# Patient Record
Sex: Female | Born: 1975 | Hispanic: Yes | Marital: Married | Smoking: Current every day smoker
Health system: Southern US, Community
[De-identification: ages and names within clinical notes are randomized; demographics above are authoritative.]

## PROBLEM LIST (undated history)

## (undated) DIAGNOSIS — D649 Anemia, unspecified: Secondary | ICD-10-CM

## (undated) DIAGNOSIS — R12 Heartburn: Secondary | ICD-10-CM

## (undated) DIAGNOSIS — F329 Major depressive disorder, single episode, unspecified: Secondary | ICD-10-CM

## (undated) DIAGNOSIS — F32A Depression, unspecified: Secondary | ICD-10-CM

## (undated) DIAGNOSIS — F419 Anxiety disorder, unspecified: Secondary | ICD-10-CM

---

## 2002-01-11 ENCOUNTER — Encounter: Payer: Self-pay | Admitting: Emergency Medicine

## 2002-01-11 ENCOUNTER — Emergency Department (HOSPITAL_COMMUNITY): Admission: EM | Admit: 2002-01-11 | Discharge: 2002-01-11 | Payer: Self-pay | Admitting: Emergency Medicine

## 2002-07-13 ENCOUNTER — Inpatient Hospital Stay (HOSPITAL_COMMUNITY): Admission: AD | Admit: 2002-07-13 | Discharge: 2002-07-13 | Payer: Self-pay

## 2002-09-24 ENCOUNTER — Ambulatory Visit (HOSPITAL_COMMUNITY): Admission: RE | Admit: 2002-09-24 | Discharge: 2002-09-24 | Payer: Self-pay

## 2002-11-02 ENCOUNTER — Inpatient Hospital Stay (HOSPITAL_COMMUNITY): Admission: AD | Admit: 2002-11-02 | Discharge: 2002-11-02 | Payer: Self-pay

## 2002-11-03 ENCOUNTER — Inpatient Hospital Stay (HOSPITAL_COMMUNITY): Admission: AD | Admit: 2002-11-03 | Discharge: 2002-11-05 | Payer: Self-pay

## 2003-01-26 ENCOUNTER — Inpatient Hospital Stay (HOSPITAL_COMMUNITY): Admission: AD | Admit: 2003-01-26 | Discharge: 2003-01-26 | Payer: Self-pay | Admitting: Obstetrics and Gynecology

## 2003-02-01 ENCOUNTER — Inpatient Hospital Stay (HOSPITAL_COMMUNITY): Admission: AD | Admit: 2003-02-01 | Discharge: 2003-02-05 | Payer: Self-pay | Admitting: Obstetrics and Gynecology

## 2003-02-02 ENCOUNTER — Encounter (INDEPENDENT_AMBULATORY_CARE_PROVIDER_SITE_OTHER): Payer: Self-pay

## 2003-10-26 ENCOUNTER — Other Ambulatory Visit: Admission: RE | Admit: 2003-10-26 | Discharge: 2003-10-26 | Payer: Self-pay | Admitting: Obstetrics and Gynecology

## 2004-02-14 ENCOUNTER — Inpatient Hospital Stay (HOSPITAL_COMMUNITY): Admission: AD | Admit: 2004-02-14 | Discharge: 2004-02-14 | Payer: Self-pay | Admitting: Obstetrics and Gynecology

## 2004-04-12 ENCOUNTER — Inpatient Hospital Stay (HOSPITAL_COMMUNITY): Admission: AD | Admit: 2004-04-12 | Discharge: 2004-04-12 | Payer: Self-pay | Admitting: Obstetrics and Gynecology

## 2004-04-23 ENCOUNTER — Inpatient Hospital Stay (HOSPITAL_COMMUNITY): Admission: RE | Admit: 2004-04-23 | Discharge: 2004-04-25 | Payer: Self-pay | Admitting: Obstetrics and Gynecology

## 2004-04-26 ENCOUNTER — Emergency Department (HOSPITAL_COMMUNITY): Admission: EM | Admit: 2004-04-26 | Discharge: 2004-04-26 | Payer: Self-pay

## 2004-11-16 ENCOUNTER — Encounter: Admission: RE | Admit: 2004-11-16 | Discharge: 2004-12-26 | Payer: Self-pay | Admitting: Family Medicine

## 2005-05-23 ENCOUNTER — Encounter: Admission: RE | Admit: 2005-05-23 | Discharge: 2005-05-23 | Payer: Self-pay | Admitting: Family Medicine

## 2006-09-25 ENCOUNTER — Other Ambulatory Visit: Admission: RE | Admit: 2006-09-25 | Discharge: 2006-09-25 | Payer: Self-pay | Admitting: Obstetrics and Gynecology

## 2007-10-12 ENCOUNTER — Ambulatory Visit (HOSPITAL_COMMUNITY): Admission: RE | Admit: 2007-10-12 | Discharge: 2007-10-12 | Payer: Self-pay | Admitting: Obstetrics and Gynecology

## 2007-10-14 ENCOUNTER — Inpatient Hospital Stay (HOSPITAL_COMMUNITY): Admission: AD | Admit: 2007-10-14 | Discharge: 2007-10-14 | Payer: Self-pay | Admitting: Obstetrics and Gynecology

## 2007-10-16 ENCOUNTER — Ambulatory Visit (HOSPITAL_COMMUNITY): Admission: RE | Admit: 2007-10-16 | Discharge: 2007-10-16 | Payer: Self-pay | Admitting: Obstetrics and Gynecology

## 2007-10-19 ENCOUNTER — Ambulatory Visit (HOSPITAL_COMMUNITY): Admission: RE | Admit: 2007-10-19 | Discharge: 2007-10-19 | Payer: Self-pay | Admitting: Obstetrics and Gynecology

## 2009-05-10 ENCOUNTER — Inpatient Hospital Stay (HOSPITAL_COMMUNITY): Admission: AD | Admit: 2009-05-10 | Discharge: 2009-05-10 | Payer: Self-pay | Admitting: Obstetrics & Gynecology

## 2010-04-09 ENCOUNTER — Encounter: Payer: Self-pay | Admitting: Obstetrics and Gynecology

## 2010-06-06 LAB — URINALYSIS, ROUTINE W REFLEX MICROSCOPIC
Bilirubin Urine: NEGATIVE
Ketones, ur: NEGATIVE mg/dL
Nitrite: NEGATIVE
Protein, ur: NEGATIVE mg/dL
Specific Gravity, Urine: <1.005 — ABNORMAL LOW (ref 1.005–1.030)
Urobilinogen, UA: 0.2 mg/dL (ref 0.0–1.0)

## 2010-06-06 LAB — CBC
HCT: 39.6 % (ref 36.0–46.0)
Hemoglobin: 13.1 g/dL (ref 12.0–15.0)
RBC: 4.81 MIL/uL (ref 3.87–5.11)
WBC: 7 10*3/uL (ref 4.0–10.5)

## 2010-06-06 LAB — HCG, SERUM, QUALITATIVE

## 2010-06-06 LAB — URINE MICROSCOPIC-ADD ON

## 2010-08-03 NOTE — Op Note (Signed)
[name], [name]                          ACCOUNT NO.:  [id number]   MEDICAL RECORD NO.:  [id number]                   PATIENT TYPE:  INP   LOCATION:  9108                                 [facility]ILITY:  [facility]   PHYSICIAN:  James A. Ashley Royalty, M.D.             DATE OF BIRTH:  1975-09-13   DATE OF PROCEDURE:  02/02/2003  DATE OF DISCHARGE:                                 OPERATIVE REPORT   PREOPERATIVE DIAGNOSES:  1. Intrauterine pregnancy at 38.[redacted] weeks gestation.  2. Premature rupture of membranes.  3. Bradycardia in labor.   POSTOPERATIVE DIAGNOSES:  1. Intrauterine pregnancy at 38.[redacted] weeks gestation.  2. Fibroid uterus (small).  3. Nuchal cord times one.   OPERATION PERFORMED:  Primary low-transverse cesarean section.   SURGEON:  Rudy Jew. Ashley Royalty, M.D.   ANESTHESIA:  Epidural.   FINDINGS:  Seven pound 2 ounce  female with Apgars 8 at one minute and 9 at  five minutes; sent to the newborn nursery.  Arterial cord pH 7.23 and venous  cord pH 7.30.   ESTIMATED BLOOD LOSS:  Eight-hundred milliliters.   COMPLICATIONS:  None.   PACKS AND DRAINS:  Foley.   COUNTS:  Sponge, needle and instrument counts reported as correct times two.   DESCRIPTION OF OPERATION:  The patient was brought to the operating room and  placed in the dorsal supine position.  She had a functioning epidural in  place as well as a Foley catheter.  She was prepped and draped in usual  manner for abdominal surgery.  A Pfannenstiel incision was made down to the  level of the fascia.  The fascia was nick with a knife and incised  transversely with Mayo scissors.  The underlying rectus muscle was separated  from the overlying fascia using sharp and blunt dissection.  The rectus  muscle was separated in the midline  exposing the peritoneum, which was  elevated with hemostats and entered atraumatically with Metzenbaum scissors.  The incision was extended longitudinally.   The uterus was identified and the  bladder flap created by incising the  anterior uterine serosa, and sharply and bluntly dissecting the bladder  inferiorly.  This was held in place with a bladder blade.  The uterus was  then entered through a low-transverse incision using sharp and blunt  dissection.   The fluid was noted to be clear.  The infant was delivered from the vertex  presentation.  Upon delivery of the head a nuchal cord was noted.  The  infant was suctioned.  The cord was triply clamped and cut, and the infant  given immediately to the waiting pediatrics team.  Arterial and venous cord  pH determinations were obtained.  Regular cord blood was obtained.  The  placenta and membranes were removed in their entirety and submitted to  pathology for histologic studies.   The uterus was exteriorized.  The uterus was noted to have a 1.5  cm fibroid  on the anterior surface and a 1/2-1 cm fibroid on the posterior surface.  The uterus was closed with two running layers of #1 Vicryl, the first layer  with a running locking layer; the second was a running, intermittent locking  and imbricating layer.  Hemostasis was noted.  The uterus, tubes and ovaries  were inspected and found otherwise normal.  They were returned to the  abdominal cavity.  Copious irrigation was accomplished.  Hemostasis was  noted.  The rectus muscles were then reapproximated in midline using 0-  Vicryl in an interrupted fashion.  The fascia was closed with 0-Vicryl in an  interrupted fashion.  The skin was closed with staples.   The patient tolerated the procedure extremely well and was taken to the  recovery room in good condition.                                               James A. Ashley Royalty, M.D.    JAM/MEDQ  D:  02/02/2003  T:  02/02/2003  Job:  [id number]

## 2010-08-03 NOTE — Consult Note (Signed)
[name], [name]                          ACCOUNT NO.:  [id number]   MEDICAL RECORD NO.:  [id number]                   PATIENT TYPE:  INP   LOCATION:  9159                                 [facility]   PHYSICIAN:  Thornton Park. Daphine Deutscher, M.D.             DATE OF BIRTH:  12-Mar-1976   DATE OF CONSULTATION:  DATE OF DISCHARGE:                                   CONSULTATION   CONSULTING PHYSICIAN:  Dr. Molli Hazard B. Daphine Deutscher.   CHIEF COMPLAINT:  Lower abdominal pain x 24 hours.   HISTORY:  This is a 35 year old lady gravida 1, para 0 whose is [redacted] weeks  gestation with a little girl, came to the ED yesterday really with low-back  pain.  She took some Robaxin which improved her back pain, but then she  started having abdominal pain bilaterally and lower pain.  Earlier today,  she had an ultrasound mainly looking at fetal movement.  She, however,  continued to complain of some lower abdominal pain that has been persistent.  She remains hungry and has not had any nausea or vomiting, although, when  she is sore it may diminish her appetite.  Currently taking Tylenol for  pain.   PHYSICAL EXAMINATION:  ABDOMEN:  Was done in the room and exam showed her to  be gravid with uterus palpated, appeared to be up to the umbilicus and  above.  The pain seemed to be more below this in the suprapubic area and  kind of bilaterally.  She did mention that when the baby kicks that this  does generate some pain.  There is no pain that is more remarkable at  McBurney's point and no pain higher than McBurney's point which would  anticipated location of the appendix at this stage in her pregnancy.  She  has had apparently normal bowel movements, no diarrhea.   CURRENT LABORATORY:  Shows a hemoglobin of 10.6 with a white count of 8,800  with a normal differential.  Amylase is normal.  Liver function studies are  all normal.   IMPRESSION:  Abdominal pain, lower abdomen, which seems that it has been  persistent today.  There is no rebound or guarding and this seems lower than  what I would expect for an appendix.   Discussed this with Dr. Galen Daft and we will get a CBC with diff in the morning.  If her pain is persistent then we may proceed with a CT scan of her abdomen  pelvis to try and look at her appendix.  Will follow.                                                Thornton Park Daphine Deutscher, M.D.    MBM/MEDQ  D:  11/03/2002  T:  11/03/2002  Job:  [id number]   cc:   Ronda Fairly. Galen Daft, M.D.  [address], [address]  [place]  [place] [address]  Fax: [phone]

## 2010-08-03 NOTE — Discharge Summary (Signed)
[name], [name]                          ACCOUNT NO.:  [id number]   MEDICAL RECORD NO.:  [id number]                   PATIENT TYPE:  INP   LOCATION:  9159                                 [facility]   PHYSICIAN:  Ronda Fairly. Galen Daft, M.D.              DATE OF BIRTH:  1975/05/28   DATE OF ADMISSION:  11/03/2002  DATE OF DISCHARGE:  11/05/2002                                 DISCHARGE SUMMARY   ADMISSION DIAGNOSES:  1. Lower abdominal pain.  2. Pregnancy.   PRINCIPAL DIAGNOSIS:  1. Lower abdominal pain.  2. Pregnancy.  3. Terminal ileitis.   PRINCIPAL PROCEDURE:  CT scan of abdomen and pelvis.   SECONDARY PROCEDURE:  Fetal ultrasound.   SECONDARY DIAGNOSIS:  Uterine fibroid.   COMPLICATIONS OF HOSPITALIZATION:  None.   CONDITION AT DISCHARGE:  Improved.   HOSPITAL COURSE:  The patient was admitted for lower abdominal pain, rated  as a 10 out of 39, on November 03, 2002.  She underwent ultrasound testing  which revealed a single intrauterine pregnancy, breech presentation,  appropriate growth without any remarkable findings.  Her white count was  normal.  She was afebrile and she was tolerating a regular diet.  Her  examination was consistent with lower abdominal pain, nonspecific findings.  General surgery was consulted and also maternal fetal medicine.  After  discussing the risks of radiographic imaging with CT scan and the failure of  complete resolution of her symptoms, on the 19th the patient underwent a CT  scan of the abdomen which revealed terminal ileitis.  Otherwise the scan was  unremarkable.  The appendix was not well visualized but there was no  evidence of an appendiceal involvement.  The white count was normal on the  day of discharge at 7.7 and her hemoglobin was 10.6 grams.  She had an  afebrile and benign examination.  She was alert, oriented, vital signs were  stable and the fetus had normal fetal heart tones.  She was discharged home  with full  instructions regarding activity limits, followup in the office.   ACTIVITY:  Her activity limits are to be at bedrest.   FOLLOWUP:  Follow up in the office next week, either Thursday or Friday.   Call the office is she developed fever, increasing abdominal pain or any  other potentially uncomfortable symptoms.  Her cervix was long and closed  throughout the hospitalization.  She had no medications on the day of  discharge.  She actually was asked to discontinue her Robaxin for previous  back discomfort and just to continue with vitamins with iron.                                                 Ronda Fairly. Galen Daft, M.D.  NJT/MEDQ  D:  11/05/2002  T:  11/05/2002  Job:  [id number]

## 2010-08-03 NOTE — Discharge Summary (Signed)
NAMECELLA, CAPPELLO                          ACCOUNT NO.:  [id number]   MEDICAL RECORD NO.:  [id number]                   PATIENT TYPE:  INP   LOCATION:  9108                                 [facility]   PHYSICIAN:  Charles A. Sydnee Cabal, MD            DATE OF BIRTH:  [birth year]   DATE OF ADMISSION:  02/01/2003  DATE OF DISCHARGE:  02/05/2003                                 DISCHARGE SUMMARY   For History and Physical see note written on the chart dated February 01, 2003.   DISPOSITION:  The patient discharged to home to follow up in the office in  two days to have staples discontinued.  She was given prescription for  Lortab 5/325 mg p.r.n. q.4 h. per Gaetano Hawthorne. Lily Peer, M.D. and convalescent  instructions were given by Dr. Lily Peer.   HOSPITAL COURSE:  The patient was admitted with ruptured membranes at term.  She was 38 weeks and four days.  She was admitted, underwent Pitocin  induction.  Pitocin was used secondary to the fact that she did not progress  into active labor on her own.  She was continued on Pitocin and was noted  during the night to have bradycardia several times to the 50s and 60s and  70s respectively.  For this reason, Fayrene Fearing A. Ashley Royalty, M.D. took the patient  to cesarean section secondary to fetal bradycardia.  Postoperatively, the  patient had routine postoperative course.  She had low-grade temperature of  100.4 on postop day #1.  She was ambulated, got out of bed.  There was  improvement in vital signs, normal temperature.  Hematocrit returned 26.6,  hemoglobin 9.1 on postop day #1.  She continues to do well postop day #2 and  postop day #3 was discharged home with follow up as noted above.  She had  spontaneous return of bowel function postop day #2 and was given general  diet, was voiding without difficulty after Foley was discontinued on postop  day #1.                                               Charles A. Sydnee Cabal, MD    CAD/MEDQ  D:   03/02/2003  T:  03/02/2003  Job:  [id number]

## 2010-08-03 NOTE — Discharge Summary (Signed)
[name], [name]              ACCOUNT NO.:  [id number]   MEDICAL RECORD NO.:  [id number]          PATIENT TYPE:  INP   LOCATION:  9116                          [facility]   PHYSICIAN:  Charles A. Delcambre, MDDATE OF BIRTH:  May 05, 1975   DATE OF ADMISSION:  04/23/2004  DATE OF DISCHARGE:                                 DISCHARGE SUMMARY   PRIMARY DISCHARGE DIAGNOSES:  1.  Intrauterine pregnancy at 39 weeks.  2.  Previous cesarean section.   PROCEDURE:  Repeat low transverse cesarean section.   DISPOSITION:  The patient discharged home to follow up in the office in 48  hours to discontinue staples.  She was given convalescent instructions,  notify of temperature greater than 100 degrees, increased pain or bleeding,  erythema about the incision, drainage from the incision, or purulence. No  heavy lifting greater than 30 pounds for 1 month, no driving for 2 weeks.  A  prescription for Percocet one to two p.o. q.4h. p.r.n. #40 and Chromagen one  p.o. daily #30 refill x1.  Postoperative hematocrit 27.5, hemoglobin 9.5.   HISTORY OF PRESENT ILLNESS:  Dictated and on the chart.   HOSPITAL COURSE:  The patient was admitted and underwent surgery as noted  above.  Postoperatively, the patient did well.  She had a vigorous female,  Apgars 9 and 9 at delivery.  Foley catheter was discontinued postoperative  day #1 and she voided without difficulty.  She was ambulating without  difficulty.  Flatus returned postoperative day #1.  She was given a general  diet.  She had no febrile morbidity.  Having done well, she desired to be  discharged home on postoperative day #2 and this was done at her request.      CAD/MEDQ  D:  04/25/2004  T:  04/25/2004  Job:  [id number]

## 2010-08-03 NOTE — Op Note (Signed)
NAMEKARSTYN, BIRKEY              ACCOUNT NO.:  [id number]   MEDICAL RECORD NO.:  [id number]          PATIENT TYPE:  INP   LOCATION:  9198                          [facility]   PHYSICIAN:  Charles A. Delcambre, MDDATE OF BIRTH:  1976/01/19   DATE OF PROCEDURE:  04/23/2004  DATE OF DISCHARGE:                                 OPERATIVE REPORT   PREOPERATIVE DIAGNOSES:  1.  Intrauterine pregnancy at 39 weeks.  2.  Previous cesarean section.   PROCEDURE:  Repeat low transverse cesarean section.   SURGEON:  Charles A. Sydnee Cabal, M.D.   ASSISTANT:  Rudy Jew. Ashley Royalty, M.D.   COMPLICATIONS:  None.   ESTIMATED BLOOD LOSS:  600 mL.   SPECIMENS:  None.   OPERATIVE FINDINGS:  A vigorous female, Apgars 9 and 9.   ANESTHESIA:  Spinal.   COMPLICATIONS:  None.   Instrument, sponge and needle count correct x2.   DESCRIPTION OF PROCEDURE:  The patient was taken to the operating room and  placed in the supine position.  After spinal was induced and anesthesia was  adequate, sterile prep and drape was undertaken.  A Pfannenstiel incision  was made and carried down to the fascia.  The fascia was incised with a  knife and Mayo scissors.  The rectus sheath was released superiorly and  inferiorly sharply.  The rectus muscles were sharply dissected in the  midline.  The peritoneum was entered without damage to bowel and bladder or  vascular structures and taken superiorly and inferiorly sharply and the  bladder flap was releasing the vesicouterine peritoneum sharply across the  lower uterine segment.  The bladder flap was brought down sharply without  damage to the bladder.  The bladder blade was placed and a lower uterine  segment transverse incision was made with a knife to amniotomy.  There was  no damage to the baby.  Traction was used to extend the incision.  __________ was used to lift the head to the incision site.  The vacuum  extractor was used to assist in delivery of the infant,  and fundal pressure  was placed by the operator's assistant.  The infant was delivered without  difficulty.  The cord was clamped.  The infant was shown to the parents,  handed off the neonatology personnel.  The placenta was manually expressed.  Internal surface of the uterus was wiped with a moistened lap.  The uterus  was closed with #1 Vicryl running locked suture in two layers, the first  layer imbricating over a second layer.  Hemostasis was adequate.  Irrigation  was carried out.  Bladder flap  hemostasis was excellent.  Subfascial hemostasis was excellent.  The fascia  was closed with #1 Vicryl running nonlocking suture.  Subcutaneous  hemostasis was excellent, and sterile skin clips were used to close the  skin.  A sterile dressing was applied.  The patient was taken to the  recovery room with physician in attendance.      CAD/MEDQ  D:  04/23/2004  T:  04/23/2004  Job:  [id number]

## 2010-08-03 NOTE — H&P (Signed)
[name], [name]              ACCOUNT NO.:  [id number]   MEDICAL RECORD NO.:  [id number]          PATIENT TYPE:  INP   LOCATION:  NA                            [facility]   PHYSICIAN:  Charles A. Delcambre, MDDATE OF BIRTH:  [birth year]   DATE OF ADMISSION:  04/23/2004  DATE OF DISCHARGE:                                HISTORY & PHYSICAL   The patient will be admitted April 23, 2004, for a repeat cesarean  section.  [facility] May 02, 2004.  At 39 weeks estimated gestational age.  She accepts risks of infection, bleeding, bowel or bladder injury,  __________ blood products risks including hepatitis and HIV exposure.  All  questions were answered.  Informed consent is given.  Prenatal labs are O  positive antibody screen negative, VDRL nonreactive, rubella immune,  hepatitis B surface antigen negative, HIV negative.  GC and Chlamydia  negative.  Cystic fibrosis negative.  TSH normal.  Quad screen normal.  One-  hour Glucola 111.  Hemoglobin 10.1 at 28 weeks.  Group B Streptococcus done  and pending as of April 18, 2004.   PAST MEDICAL HISTORY:  Negative.   SURGICAL HISTORY:  Cesarean section, low transverse.   MEDICATIONS:  1.  Prenatal vitamins.  2.  Iron.   ALLERGIES:  No known drug allergies.   SOCIAL HISTORY:  Past history of smoking, none currently.  No drug use.  The  patient is not married.  She is in a relationship monogamously.   FAMILY HISTORY:  Cousin with gastroschisis.  Otherwise negative.   REVIEW OF SYSTEMS:  Currently, no chest pain, shortness of breath, fever,  chills, cough, nausea, vomiting, bleeding, contractions, or rupture of  membranes.  She notes active fetal movement.   PHYSICAL EXAMINATION:  GENERAL:  Alert, oriented x 3.  BLOOD PRESSURE:  100/72.  RESPIRATIONS:  20.  PULSE:  90.  HEENT:  Grossly within normal limits.  NECK:  Supple, without thyromegaly or adenopathy.  LUNGS:  Clear bilaterally.  HEART:  Regular rate and rhythm.  2/6  systolic ejection murmur left sternal  border.  BREAST:  No masses, tenderness, or discharge.  No nipple changes  bilaterally.  ABDOMEN:  Fundal height 36 cm.  Fetal heart rate 150.  PELVIC:  Normal external female genitalia.  Digital exam of the cervix  posterior and closed.  EXTREMITIES:  Mild edema.   ASSESSMENT:  Intrauterine pregnancy.  Plan to be 39 weeks at time of  admission, for repeat cesarean section.   PLAN:  N.p.o. past midnight.  Preoperative CBC __________.  All questions  were answered.  Will proceed as outlined for repeat cesarean section.      CAD/MEDQ  D:  04/18/2004  T:  04/18/2004  Job:  [id number]

## 2010-09-21 LAB — ABO/RH: RH Type: POSITIVE

## 2010-10-17 ENCOUNTER — Encounter (HOSPITAL_COMMUNITY): Payer: Self-pay | Admitting: *Deleted

## 2010-10-17 ENCOUNTER — Inpatient Hospital Stay (HOSPITAL_COMMUNITY)
Admission: AD | Admit: 2010-10-17 | Discharge: 2010-10-17 | Disposition: A | Discharge status: Home / Self Care | Payer: Medicaid Other | Source: Ambulatory Visit | Attending: Obstetrics & Gynecology | Admitting: Obstetrics & Gynecology

## 2010-10-17 DIAGNOSIS — Z87891 Personal history of nicotine dependence: Secondary | ICD-10-CM | POA: Insufficient documentation

## 2010-10-17 DIAGNOSIS — R109 Unspecified abdominal pain: Secondary | ICD-10-CM | POA: Insufficient documentation

## 2010-10-17 DIAGNOSIS — R350 Frequency of micturition: Secondary | ICD-10-CM | POA: Insufficient documentation

## 2010-10-17 DIAGNOSIS — R300 Dysuria: Secondary | ICD-10-CM

## 2010-10-17 DIAGNOSIS — R3915 Urgency of urination: Secondary | ICD-10-CM | POA: Insufficient documentation

## 2010-10-17 HISTORY — DX: Depression, unspecified: F32.A

## 2010-10-17 HISTORY — DX: Major depressive disorder, single episode, unspecified: F32.9

## 2010-10-17 HISTORY — DX: Anxiety disorder, unspecified: F41.9

## 2010-10-17 LAB — URINALYSIS, ROUTINE W REFLEX MICROSCOPIC
Glucose, UA: NEGATIVE mg/dL
Hgb urine dipstick: NEGATIVE
Leukocytes, UA: NEGATIVE
Specific Gravity, Urine: >1.03 — ABNORMAL HIGH (ref 1.005–1.030)

## 2010-10-17 MED ORDER — CEPHALEXIN 500 MG PO CAPS: 500.0000 mg | ORAL_CAPSULE | Freq: Four times a day (QID) | ORAL | Status: AC

## 2010-10-17 NOTE — ED Provider Notes (Signed)
History   Pt presents today c/o urinary frequency and abd pain for the past week. She states the pain is relieved when she urinates. She states it feels like she has a UTI. She denies fever, vag dc, bleeding, or any other sx at this time.  Chief Complaint  Patient presents with  . Dysuria   HPI  OB History    Grav Para Term Preterm Abortions TAB SAB Ect Mult Living   5 2 2  2  2   2       Past Medical History  Diagnosis Date  . Depression   . Anxiety     Past Surgical History  Procedure Date  . Cesarean section     No family history on file.  History  Substance Use Topics  . Smoking status: Former Games developer  . Smokeless tobacco: Not on file  . Alcohol Use: No    Allergies:  Allergies  Allergen Reactions  . Codeine Other (See Comments)    Unsure .[name] Pt states at times she still takes the codeine.  [name] Percocet (Oxycodone-Acetaminophen) Swelling    Throat swells  . Latex Rash    Around vaginal area gets irratation.    Prescriptions prior to admission  Medication Sig Dispense Refill  . acetaminophen (TYLENOL) 325 MG tablet Take 650 mg by mouth every 6 (six) hours as needed. As needed for pain       . loratadine (CLARITIN) 10 MG tablet Take 10 mg by mouth daily.        . prenatal vitamin w/FE, FA (PRENATAL 1 + 1) 27-1 MG TABS Take 1 tablet by mouth daily.          Review of Systems  Constitutional: Negative for fever and chills.  Cardiovascular: Negative for chest pain.  Gastrointestinal: Negative for nausea, vomiting, diarrhea and constipation.  Genitourinary: Positive for dysuria, urgency and frequency. Negative for hematuria and flank pain.  Neurological: Negative for dizziness and headaches.  Psychiatric/Behavioral: Negative for depression and suicidal ideas.   Physical Exam   Blood pressure 103/67, pulse 89, temperature 98.6 F (37 C), temperature source Oral, resp. rate 16, height 5\' 2"  (1.575 m), weight 161 lb (73.029 kg), last menstrual period  08/09/2010.  Physical Exam  Constitutional: She is oriented to person, place, and time. She appears well-developed and well-nourished. No distress.  HENT:  Head: Normocephalic and atraumatic.  Eyes: EOM are normal. Pupils are equal, round, and reactive to light.  GI: Soft. She exhibits no distension and no mass. There is no tenderness. There is no rebound and no guarding.  Genitourinary: No vaginal discharge found.  Neurological: She is alert and oriented to person, place, and time.  Skin: Skin is warm and dry. She is not diaphoretic.  Psychiatric: She has a normal mood and affect. Thought content normal.    MAU Course  Procedures  Results for orders placed during the hospital encounter of 10/17/10 (from the past 24 hour(s))  URINALYSIS, ROUTINE W REFLEX MICROSCOPIC     Status: Abnormal   Collection Time   10/17/10 10:00 PM      Component Value Range   Color, Urine YELLOW  YELLOW    Appearance CLEAR  CLEAR    Specific Gravity, Urine >1.030 (*) 1.005 - 1.030    pH 6.0  5.0 - 8.0    Glucose, UA NEGATIVE  NEGATIVE (mg/dL)   Hgb urine dipstick NEGATIVE  NEGATIVE    Bilirubin Urine NEGATIVE  NEGATIVE    Ketones, ur NEGATIVE  NEGATIVE (mg/dL)   Protein, ur NEGATIVE  NEGATIVE (mg/dL)   Urobilinogen, UA 0.2  0.0 - 1.0 (mg/dL)   Nitrite NEGATIVE  NEGATIVE    Leukocytes, UA NEGATIVE  NEGATIVE      Assessment and Plan  Dysuria: discussed with pt at length. Will tx prophylactically with Keflex. She has a f/u appt scheduled. Discussed diet, activity, risks, and precautions.   10/17/2010, 11:46 PM   Henrietta Hoover, PA 10/17/10 2350

## 2010-10-17 NOTE — Progress Notes (Cosign Needed)
Pt having frequency and pain with urination, lower back pain and lower abd pressure.  Pt G5 P2 at 11.2wks.

## 2010-10-31 ENCOUNTER — Other Ambulatory Visit (HOSPITAL_COMMUNITY)
Admission: RE | Admit: 2010-10-31 | Discharge: 2010-10-31 | Disposition: A | Discharge status: Home / Self Care | Payer: Medicaid Other | Source: Ambulatory Visit | Attending: Obstetrics and Gynecology | Admitting: Obstetrics and Gynecology

## 2010-10-31 ENCOUNTER — Other Ambulatory Visit: Payer: Self-pay | Admitting: Obstetrics and Gynecology

## 2010-10-31 DIAGNOSIS — Z113 Encounter for screening for infections with a predominantly sexual mode of transmission: Secondary | ICD-10-CM | POA: Insufficient documentation

## 2010-10-31 DIAGNOSIS — Z01419 Encounter for gynecological examination (general) (routine) without abnormal findings: Secondary | ICD-10-CM | POA: Insufficient documentation

## 2010-12-14 LAB — CBC
HCT: 35.6 — ABNORMAL LOW
MCV: 82.3
RBC: 4.32
WBC: 8.6

## 2010-12-14 LAB — HCG, QUANTITATIVE, PREGNANCY: hCG, Beta Chain, Quant, S: 6352 — ABNORMAL HIGH

## 2011-01-02 ENCOUNTER — Encounter (HOSPITAL_COMMUNITY): Payer: Self-pay | Admitting: *Deleted

## 2011-01-02 ENCOUNTER — Inpatient Hospital Stay (HOSPITAL_COMMUNITY)
Admission: AD | Admit: 2011-01-02 | Discharge: 2011-01-03 | Disposition: A | Discharge status: Home / Self Care | Payer: Medicaid Other | Source: Ambulatory Visit | Attending: Obstetrics and Gynecology | Admitting: Obstetrics and Gynecology

## 2011-01-02 ENCOUNTER — Inpatient Hospital Stay (HOSPITAL_COMMUNITY): Payer: Medicaid Other

## 2011-01-02 DIAGNOSIS — O26899 Other specified pregnancy related conditions, unspecified trimester: Secondary | ICD-10-CM

## 2011-01-02 DIAGNOSIS — R109 Unspecified abdominal pain: Secondary | ICD-10-CM

## 2011-01-02 DIAGNOSIS — O99891 Other specified diseases and conditions complicating pregnancy: Secondary | ICD-10-CM | POA: Insufficient documentation

## 2011-01-02 LAB — URINALYSIS, ROUTINE W REFLEX MICROSCOPIC
Ketones, ur: NEGATIVE mg/dL
Leukocytes, UA: NEGATIVE
Protein, ur: NEGATIVE mg/dL
Specific Gravity, Urine: 1.01 (ref 1.005–1.030)
pH: 7 (ref 5.0–8.0)

## 2011-01-02 MED ORDER — ACETAMINOPHEN 325 MG PO TABS
650.0000 mg | ORAL_TABLET | Freq: Once | ORAL | Status: AC
  Administered 2011-01-02: 650 mg via ORAL
  Filled 2011-01-02: qty 2

## 2011-01-02 NOTE — Progress Notes (Signed)
Pt reports pain in lower abd x 5 hours. Pt reports she had to "break up " her two dogs that were fighting and the pain started right after that, states she was not attacked by the dogs. Denies vaginal bleeding. G5 P2 , SAB x 2

## 2011-01-02 NOTE — ED Provider Notes (Signed)
History     Chief Complaint  Patient presents with  . Abdominal Pain   HPIJessica Taralyn Johnston y.o. patient of Dr. Ardeen Fillers is now 22w gestations and presents with lower abdominal pain.  G5 [id number].  Pain began at 5:30 after she had to separate her cat and dogs.  Denies falling but had to get "on top of the dog to get cat out of the dog's mouth".  Denies vaginal bleeding or leaking fluid.  States the pain is mostly lower but now on the sides.  She has spoken to Dr. Dion Body twice.  Rested as advised.  Rates the pain as 3/10.  Has not tried anything for the pain.     Past Medical History  Diagnosis Date  . Depression   . Anxiety     Past Surgical History  Procedure Date  . Cesarean section     History reviewed. No pertinent family history.  History  Substance Use Topics  . Smoking status: Former Games developer  . Smokeless tobacco: Not on file  . Alcohol Use: No    Allergies:  Allergies  Allergen Reactions  . Codeine Other (See Comments)    Unsure .[name] Pt states at times she still takes the codeine.  [name] Percocet (Oxycodone-Acetaminophen) Swelling    Throat swells  . Latex Rash    Around vaginal area gets irratation.    Prescriptions prior to admission  Medication Sig Dispense Refill  . acetaminophen (TYLENOL) 325 MG tablet Take 650 mg by mouth every 6 (six) hours as needed. for pain      . loratadine (CLARITIN) 10 MG tablet Take 10 mg by mouth daily.        . prenatal vitamin w/FE, FA (PRENATAL 1 + 1) 27-1 MG TABS Take 1 tablet by mouth daily.          Review of Systems  Gastrointestinal: Positive for abdominal pain. Negative for nausea and vomiting.  Genitourinary:       Neg for vaginal bleeding or loss of fluid.  Musculoskeletal: Negative for falls.   Physical Exam   Blood pressure 104/58, pulse 93, temperature 99 F (37.2 C), resp. rate 18, height 5\' 2"  (1.575 m), weight 170 lb (77.111 kg), last menstrual period 08/09/2010.  Physical Exam  Constitutional: She is  oriented to person, place, and time. She appears well-developed and well-nourished. No distress.       FHR by doppler 145  GI: She exhibits no mass. There is tenderness (lower quadrants bilaterally.  more on the left.). There is no rebound and no guarding.       Gravid with fundus just above the umbilicus  Neurological: She is alert and oriented to person, place, and time.  Skin: Skin is warm and dry.   Results for orders placed during the hospital encounter of 01/02/11 (from the past 24 hour(s))  URINALYSIS, ROUTINE W REFLEX MICROSCOPIC     Status: Abnormal   Collection Time   01/02/11 10:05 PM      Component Value Range   Color, Urine YELLOW  YELLOW    Appearance HAZY (*) CLEAR    Specific Gravity, Urine 1.010  1.005 - 1.030    pH 7.0  5.0 - 8.0    Glucose, UA 500 (*) NEGATIVE (mg/dL)   Hgb urine dipstick NEGATIVE  NEGATIVE    Bilirubin Urine NEGATIVE  NEGATIVE    Ketones, ur NEGATIVE  NEGATIVE (mg/dL)   Protein, ur NEGATIVE  NEGATIVE (mg/dL)   Urobilinogen, UA 0.2  0.0 -  1.0 (mg/dL)   Nitrite NEGATIVE  NEGATIVE    Leukocytes, UA NEGATIVE  NEGATIVE     ULTRASOUND REPORT:  22w gestation, FNR 144, Placental posterior , above cervical os; no definite evidence of acute placental abruption.  Amniotic Fluid Vol, subjectively WNL.  Cx length 4.8.  EDD 05/08/11  MAU Course  Procedures  MDM Spoke with Dr. Idamae Schuller at 10:50.  Order given to give Tylenol 2 tabs po now and ultrasound if abdominal pain.  Keep scheduled appt with Dr. Richardson Dopp for tomorrow at noon.   Patient states she can take Tylenol without side effects.    Assessment and Plan  A: Abdominal pain at 22w gestation  P: Instructed to take Tylenol prn pain. May use heating pad.   Keep scheduled appointment with Dr. Richardson Dopp for tomorrow at noon.     Omaree Fuqua,EVE M 01/02/2011, 10:29 PM   Matt Holmes, NP 01/02/11 2345

## 2011-01-31 LAB — RPR: RPR: NONREACTIVE

## 2011-02-08 ENCOUNTER — Inpatient Hospital Stay (HOSPITAL_COMMUNITY)
Admission: AD | Admit: 2011-02-08 | Discharge: 2011-02-08 | Disposition: A | Discharge status: Home / Self Care | Payer: Medicaid Other | Source: Ambulatory Visit | Attending: Obstetrics and Gynecology | Admitting: Obstetrics and Gynecology

## 2011-02-08 ENCOUNTER — Encounter (HOSPITAL_COMMUNITY): Payer: Self-pay | Admitting: *Deleted

## 2011-02-08 DIAGNOSIS — R10814 Left lower quadrant abdominal tenderness: Secondary | ICD-10-CM

## 2011-02-08 DIAGNOSIS — Z3480 Encounter for supervision of other normal pregnancy, unspecified trimester: Secondary | ICD-10-CM

## 2011-02-08 DIAGNOSIS — R1032 Left lower quadrant pain: Secondary | ICD-10-CM | POA: Insufficient documentation

## 2011-02-08 DIAGNOSIS — O99891 Other specified diseases and conditions complicating pregnancy: Secondary | ICD-10-CM | POA: Insufficient documentation

## 2011-02-08 DIAGNOSIS — N949 Unspecified condition associated with female genital organs and menstrual cycle: Secondary | ICD-10-CM

## 2011-02-08 LAB — URINALYSIS, ROUTINE W REFLEX MICROSCOPIC
Bilirubin Urine: NEGATIVE
Glucose, UA: NEGATIVE mg/dL
Ketones, ur: NEGATIVE mg/dL
Nitrite: NEGATIVE
pH: 6 (ref 5.0–8.0)

## 2011-02-08 NOTE — Progress Notes (Signed)
Pt states, " Lower left back pain woke me up this morning and it goes all the way through into my lower left abdomen."

## 2011-02-08 NOTE — ED Provider Notes (Signed)
History   Pt presents today c/o lower back pain that radiates to her LLQ. She states she has constant back pain but the LLQ pain "comes and goes" and is sharp. She reports GFM and denies vag dc, bleeding, or any other sx at this time.  Chief Complaint  Patient presents with  . Back Pain   HPI  OB History    Grav Para Term Preterm Abortions TAB SAB Ect Mult Living   5 2 2  2  2   2       Past Medical History  Diagnosis Date  . Depression   . Anxiety     Past Surgical History  Procedure Date  . Cesarean section     No family history on file.  History  Substance Use Topics  . Smoking status: Former Games developer  . Smokeless tobacco: Not on file  . Alcohol Use: No    Allergies:  Allergies  Allergen Reactions  . Codeine Nausea Only    Pt takes Tylenol #3  . Percocet (Oxycodone-Acetaminophen) Swelling    Throat swells  . Latex Rash    Around vaginal area gets irratation.    Prescriptions prior to admission  Medication Sig Dispense Refill  . acetaminophen (TYLENOL) 325 MG tablet Take 325-650 mg by mouth every 6 (six) hours as needed. for pain      . butalbital-acetaminophen-caffeine (FIORICET) 50-325-40 MG per tablet Take 1 tablet by mouth every 6 (six) hours as needed. For headache       . loratadine (CLARITIN) 10 MG tablet Take 10 mg by mouth daily as needed. For allergies       . prenatal vitamin w/FE, FA (PRENATAL 1 + 1) 27-1 MG TABS Take 1 tablet by mouth daily.         Review of Systems  Constitutional: Negative for fever.  Eyes: Negative for blurred vision.  Cardiovascular: Negative for chest pain.  Gastrointestinal: Positive for abdominal pain. Negative for nausea, vomiting, diarrhea and constipation.  Genitourinary: Positive for frequency. Negative for dysuria, urgency and hematuria.  Neurological: Negative for dizziness and headaches.  Psychiatric/Behavioral: Negative for depression and suicidal ideas.   Physical Exam   Blood pressure 97/58, pulse 97,  temperature 97.6 F (36.4 C), temperature source Axillary, resp. rate 18, height 5\' 2"  (1.575 m), weight 175 lb 8 oz (79.606 kg), last menstrual period 08/09/2010, SpO2 97.00%.  Physical Exam  Nursing note and vitals reviewed. Constitutional: She is oriented to person, place, and time. She appears well-developed and well-nourished. No distress.  HENT:  Head: Normocephalic and atraumatic.  Eyes: EOM are normal. Pupils are equal, round, and reactive to light.  GI: Soft. She exhibits no distension. There is no tenderness. There is no rebound and no guarding.  Genitourinary: No bleeding around the vagina. No vaginal discharge found.       Cervix Lg/closed.  Neurological: She is alert and oriented to person, place, and time.  Skin: Skin is warm and dry. She is not diaphoretic.  Psychiatric: She has a normal mood and affect. Her behavior is normal. Judgment and thought content normal.    MAU Course  Procedures  Results for orders placed during the hospital encounter of 02/08/11 (from the past 24 hour(s))  URINALYSIS, ROUTINE W REFLEX MICROSCOPIC     Status: Normal   Collection Time   02/08/11  7:25 PM      Component Value Range   Color, Urine YELLOW  YELLOW    Appearance CLEAR  CLEAR  Specific Gravity, Urine 1.025  1.005 - 1.030    pH 6.0  5.0 - 8.0    Glucose, UA NEGATIVE  NEGATIVE (mg/dL)   Hgb urine dipstick NEGATIVE  NEGATIVE    Bilirubin Urine NEGATIVE  NEGATIVE    Ketones, ur NEGATIVE  NEGATIVE (mg/dL)   Protein, ur NEGATIVE  NEGATIVE (mg/dL)   Urobilinogen, UA 0.2  0.0 - 1.0 (mg/dL)   Nitrite NEGATIVE  NEGATIVE    Leukocytes, UA NEGATIVE  NEGATIVE    NST reactive for 27wks.  Discussed pt with Dr. Richardson Dopp. OK to dc to home with precautions.  Assessment and Plan  Round ligament pain: discussed with pt at length. She will f/u with Dr. Richardson Dopp. Discussed diet, activity, risks, and precautions.  Clinton Gallant. Chantelle Verdi III, DrHSc, MPAS, PA-C  02/08/2011, 8:03 PM   Henrietta Hoover,  PA 02/08/11 2105  Henrietta Hoover, PA 02/08/11 2105

## 2011-04-16 ENCOUNTER — Other Ambulatory Visit: Payer: Self-pay | Admitting: Obstetrics and Gynecology

## 2011-04-21 ENCOUNTER — Encounter (HOSPITAL_COMMUNITY): Payer: Self-pay | Admitting: Pharmacist

## 2011-04-26 ENCOUNTER — Encounter (HOSPITAL_COMMUNITY): Payer: Self-pay

## 2011-04-26 ENCOUNTER — Encounter (HOSPITAL_COMMUNITY)
Admission: RE | Admit: 2011-04-26 | Discharge: 2011-04-26 | Disposition: A | Discharge status: Home / Self Care | Payer: Medicaid Other | Source: Ambulatory Visit | Attending: Obstetrics and Gynecology | Admitting: Obstetrics and Gynecology

## 2011-04-26 HISTORY — DX: Anemia, unspecified: D64.9

## 2011-04-26 HISTORY — DX: Heartburn: R12

## 2011-04-26 LAB — CBC
HCT: 34 % — ABNORMAL LOW (ref 36.0–46.0)
Hemoglobin: 11.1 g/dL — ABNORMAL LOW (ref 12.0–15.0)
MCH: 26.9 pg (ref 26.0–34.0)
MCHC: 32.6 g/dL (ref 30.0–36.0)
MCV: 82.3 fL (ref 78.0–100.0)

## 2011-04-26 LAB — SURGICAL PCR SCREEN: MRSA, PCR: NEGATIVE

## 2011-04-26 NOTE — Patient Instructions (Signed)
Your procedure is scheduled on: Thur., February 14  Enter through the Main Entrance of [facility] at: 9:00am Pick up the phone at the desk and dial [phone] and inform us of your arrival.  Please call this number if you have any problems the morning of surgery: [phone]  Remember: Do not eat food after midnight: Wednesday Do not drink clear liquids after: 6:30am Take these medicines the morning of surgery with a SIP OF WATER:  Zantac  Do not wear jewelry, make-up, or FINGER nail polish Do not wear lotions, powders, perfumes or deodorant. Do not shave 48 hours prior to surgery. Do not bring valuables to the hospital.  Leave suitcase in the car. After Surgery it may be brought to your room. For patients being admitted to the hospital, checkout time is 11:00am the day of discharge.  Remember to use your hibiclens as instructed.Please shower with 1/2 bottle the evening before your surgery and the other 1/2 bottle the morning of surgery.

## 2011-05-02 ENCOUNTER — Encounter (HOSPITAL_COMMUNITY): Payer: Self-pay | Admitting: *Deleted

## 2011-05-02 ENCOUNTER — Encounter (HOSPITAL_COMMUNITY)
Admission: AD | Disposition: A | Discharge status: Home / Self Care | Payer: Self-pay | Source: Ambulatory Visit | Attending: Obstetrics and Gynecology

## 2011-05-02 ENCOUNTER — Inpatient Hospital Stay (HOSPITAL_COMMUNITY)
Admission: AD | Admit: 2011-05-02 | Discharge: 2011-05-05 | DRG: 766 | Disposition: A | Discharge status: Home / Self Care | Payer: Medicaid Other | Source: Ambulatory Visit | Attending: Obstetrics and Gynecology | Admitting: Obstetrics and Gynecology

## 2011-05-02 ENCOUNTER — Other Ambulatory Visit: Payer: Self-pay | Admitting: Obstetrics and Gynecology

## 2011-05-02 ENCOUNTER — Inpatient Hospital Stay (HOSPITAL_COMMUNITY): Payer: Medicaid Other | Admitting: Anesthesiology

## 2011-05-02 ENCOUNTER — Encounter (HOSPITAL_COMMUNITY): Payer: Self-pay | Admitting: Anesthesiology

## 2011-05-02 DIAGNOSIS — O9903 Anemia complicating the puerperium: Secondary | ICD-10-CM | POA: Diagnosis not present

## 2011-05-02 DIAGNOSIS — Z8659 Personal history of other mental and behavioral disorders: Secondary | ICD-10-CM

## 2011-05-02 DIAGNOSIS — Z98891 History of uterine scar from previous surgery: Secondary | ICD-10-CM

## 2011-05-02 DIAGNOSIS — G44009 Cluster headache syndrome, unspecified, not intractable: Secondary | ICD-10-CM | POA: Diagnosis not present

## 2011-05-02 DIAGNOSIS — D649 Anemia, unspecified: Secondary | ICD-10-CM | POA: Diagnosis not present

## 2011-05-02 DIAGNOSIS — Z01818 Encounter for other preprocedural examination: Secondary | ICD-10-CM

## 2011-05-02 DIAGNOSIS — Z8759 Personal history of other complications of pregnancy, childbirth and the puerperium: Secondary | ICD-10-CM

## 2011-05-02 DIAGNOSIS — Z87891 Personal history of nicotine dependence: Secondary | ICD-10-CM

## 2011-05-02 DIAGNOSIS — O34219 Maternal care for unspecified type scar from previous cesarean delivery: Principal | ICD-10-CM | POA: Diagnosis present

## 2011-05-02 DIAGNOSIS — Z01812 Encounter for preprocedural laboratory examination: Secondary | ICD-10-CM

## 2011-05-02 DIAGNOSIS — Z302 Encounter for sterilization: Secondary | ICD-10-CM

## 2011-05-02 HISTORY — DX: Cluster headache syndrome, unspecified, not intractable: G44.009

## 2011-05-02 HISTORY — DX: Personal history of other complications of pregnancy, childbirth and the puerperium: Z87.59

## 2011-05-02 HISTORY — DX: Personal history of nicotine dependence: Z87.891

## 2011-05-02 HISTORY — DX: Personal history of other mental and behavioral disorders: Z86.59

## 2011-05-02 LAB — TYPE AND SCREEN: Antibody Screen: NEGATIVE

## 2011-05-02 SURGERY — Surgical Case
Anesthesia: Spinal | Site: Uterus | Wound class: Clean Contaminated

## 2011-05-02 MED ORDER — NALOXONE HCL 0.4 MG/ML IJ SOLN: 0.4000 mg | INTRAMUSCULAR | Status: DC | PRN

## 2011-05-02 MED ORDER — KETOROLAC TROMETHAMINE 30 MG/ML IJ SOLN
INTRAMUSCULAR | Status: AC
  Administered 2011-05-02: 30 mg via INTRAVENOUS
  Filled 2011-05-02: qty 1

## 2011-05-02 MED ORDER — LACTATED RINGERS IV SOLN
INTRAVENOUS | Status: DC
  Administered 2011-05-02 (×3): via INTRAVENOUS

## 2011-05-02 MED ORDER — FENTANYL CITRATE 0.05 MG/ML IJ SOLN
INTRAMUSCULAR | Status: DC | PRN
  Administered 2011-05-02: 25 ug via INTRATHECAL

## 2011-05-02 MED ORDER — NALBUPHINE HCL 10 MG/ML IJ SOLN
5.0000 mg | INTRAMUSCULAR | Status: DC | PRN
  Administered 2011-05-02: 10 mg via INTRAVENOUS

## 2011-05-02 MED ORDER — SODIUM CHLORIDE 0.9 % IJ SOLN: 3.0000 mL | INTRAMUSCULAR | Status: DC | PRN

## 2011-05-02 MED ORDER — TETANUS-DIPHTH-ACELL PERTUSSIS 5-2.5-18.5 LF-MCG/0.5 IM SUSP: 0.5000 mL | Freq: Once | INTRAMUSCULAR | Status: DC

## 2011-05-02 MED ORDER — SCOPOLAMINE 1 MG/3DAYS TD PT72: 1.0000 | MEDICATED_PATCH | Freq: Once | TRANSDERMAL | Status: DC

## 2011-05-02 MED ORDER — DIBUCAINE 1 % RE OINT: 1.0000 "application " | TOPICAL_OINTMENT | RECTAL | Status: DC | PRN

## 2011-05-02 MED ORDER — KETOROLAC TROMETHAMINE 30 MG/ML IJ SOLN
30.0000 mg | Freq: Four times a day (QID) | INTRAMUSCULAR | Status: AC | PRN
  Administered 2011-05-02 (×2): 30 mg via INTRAVENOUS
  Filled 2011-05-02: qty 1

## 2011-05-02 MED ORDER — ONDANSETRON HCL 4 MG PO TABS: 4.0000 mg | ORAL_TABLET | ORAL | Status: DC | PRN

## 2011-05-02 MED ORDER — BUTALBITAL-APAP-CAFFEINE 50-325-40 MG PO TABS: 1.0000 | ORAL_TABLET | Freq: Four times a day (QID) | ORAL | Status: DC | PRN

## 2011-05-02 MED ORDER — MORPHINE SULFATE 0.5 MG/ML IJ SOLN
INTRAMUSCULAR | Status: AC
  Filled 2011-05-02: qty 10

## 2011-05-02 MED ORDER — DIPHENHYDRAMINE HCL 50 MG/ML IJ SOLN
INTRAMUSCULAR | Status: DC | PRN
  Administered 2011-05-02 (×2): 12.5 mg via INTRAVENOUS

## 2011-05-02 MED ORDER — OXYTOCIN 10 UNIT/ML IJ SOLN
INTRAMUSCULAR | Status: AC
  Filled 2011-05-02: qty 2

## 2011-05-02 MED ORDER — SODIUM CHLORIDE 0.9 % IV SOLN: 1.0000 ug/kg/h | INTRAVENOUS | Status: DC | PRN

## 2011-05-02 MED ORDER — ONDANSETRON HCL 4 MG/2ML IJ SOLN: 4.0000 mg | INTRAMUSCULAR | Status: DC | PRN

## 2011-05-02 MED ORDER — BUPIVACAINE IN DEXTROSE 0.75-8.25 % IT SOLN
INTRATHECAL | Status: DC | PRN
  Administered 2011-05-02: 1.5 mL via INTRATHECAL

## 2011-05-02 MED ORDER — SCOPOLAMINE 1 MG/3DAYS TD PT72
MEDICATED_PATCH | TRANSDERMAL | Status: AC
  Administered 2011-05-02: 1.5 mg via TRANSDERMAL
  Filled 2011-05-02: qty 1

## 2011-05-02 MED ORDER — OXYTOCIN 10 UNIT/ML IJ SOLN
INTRAMUSCULAR | Status: DC | PRN
  Administered 2011-05-02: 20 [IU] via INTRAMUSCULAR

## 2011-05-02 MED ORDER — PHENYLEPHRINE HCL 10 MG/ML IJ SOLN
INTRAMUSCULAR | Status: DC | PRN
  Administered 2011-05-02: 80 ug via INTRAVENOUS
  Administered 2011-05-02: 40 ug via INTRAVENOUS

## 2011-05-02 MED ORDER — LACTATED RINGERS IV SOLN
INTRAVENOUS | Status: DC
  Administered 2011-05-03: 01:00:00 via INTRAVENOUS

## 2011-05-02 MED ORDER — ZOLPIDEM TARTRATE 5 MG PO TABS: 5.0000 mg | ORAL_TABLET | Freq: Every evening | ORAL | Status: DC | PRN

## 2011-05-02 MED ORDER — OXYTOCIN 20 UNITS IN LACTATED RINGERS INFUSION - SIMPLE
125.0000 mL/h | INTRAVENOUS | Status: AC
  Administered 2011-05-02: 125 mL/h via INTRAVENOUS

## 2011-05-02 MED ORDER — SENNOSIDES-DOCUSATE SODIUM 8.6-50 MG PO TABS
2.0000 | ORAL_TABLET | Freq: Every day | ORAL | Status: DC
  Administered 2011-05-03 – 2011-05-04 (×2): 2 via ORAL

## 2011-05-02 MED ORDER — IBUPROFEN 600 MG PO TABS: 600.0000 mg | ORAL_TABLET | Freq: Four times a day (QID) | ORAL | Status: DC | PRN

## 2011-05-02 MED ORDER — SIMETHICONE 80 MG PO CHEW
80.0000 mg | CHEWABLE_TABLET | Freq: Three times a day (TID) | ORAL | Status: DC
  Administered 2011-05-03 – 2011-05-05 (×6): 80 mg via ORAL

## 2011-05-02 MED ORDER — KETOROLAC TROMETHAMINE 30 MG/ML IJ SOLN: 30.0000 mg | Freq: Four times a day (QID) | INTRAMUSCULAR | Status: AC | PRN

## 2011-05-02 MED ORDER — ONDANSETRON HCL 4 MG/2ML IJ SOLN
INTRAMUSCULAR | Status: DC | PRN
  Administered 2011-05-02: 4 mg via INTRAVENOUS

## 2011-05-02 MED ORDER — PRENATAL MULTIVITAMIN CH
1.0000 | ORAL_TABLET | Freq: Every day | ORAL | Status: DC
  Administered 2011-05-03 – 2011-05-05 (×3): 1 via ORAL
  Filled 2011-05-02 (×3): qty 1

## 2011-05-02 MED ORDER — FAMOTIDINE 20 MG PO TABS: 20.0000 mg | ORAL_TABLET | Freq: Two times a day (BID) | ORAL | Status: DC | PRN

## 2011-05-02 MED ORDER — ONDANSETRON HCL 4 MG/2ML IJ SOLN
INTRAMUSCULAR | Status: AC
  Filled 2011-05-02: qty 2

## 2011-05-02 MED ORDER — FENTANYL CITRATE 0.05 MG/ML IJ SOLN
INTRAMUSCULAR | Status: AC
  Filled 2011-05-02: qty 2

## 2011-05-02 MED ORDER — CEFAZOLIN SODIUM 1-5 GM-% IV SOLN
INTRAVENOUS | Status: AC
  Administered 2011-05-02: 2 g via INTRAVENOUS
  Filled 2011-05-02: qty 100

## 2011-05-02 MED ORDER — PHENYLEPHRINE 40 MCG/ML (10ML) SYRINGE FOR IV PUSH (FOR BLOOD PRESSURE SUPPORT)
PREFILLED_SYRINGE | INTRAVENOUS | Status: AC
  Filled 2011-05-02: qty 5

## 2011-05-02 MED ORDER — IBUPROFEN 600 MG PO TABS
600.0000 mg | ORAL_TABLET | Freq: Four times a day (QID) | ORAL | Status: DC
  Administered 2011-05-03 – 2011-05-05 (×10): 600 mg via ORAL
  Filled 2011-05-02 (×10): qty 1

## 2011-05-02 MED ORDER — METOCLOPRAMIDE HCL 5 MG/ML IJ SOLN: 10.0000 mg | Freq: Once | INTRAMUSCULAR | Status: DC | PRN

## 2011-05-02 MED ORDER — DIPHENHYDRAMINE HCL 50 MG/ML IJ SOLN
INTRAMUSCULAR | Status: AC
  Filled 2011-05-02: qty 1

## 2011-05-02 MED ORDER — NALBUPHINE SYRINGE 5 MG/0.5 ML
INJECTION | INTRAMUSCULAR | Status: AC
  Administered 2011-05-02: 10 mg via SUBCUTANEOUS
  Filled 2011-05-02: qty 1

## 2011-05-02 MED ORDER — MENTHOL 3 MG MT LOZG: 1.0000 | LOZENGE | OROMUCOSAL | Status: DC | PRN

## 2011-05-02 MED ORDER — MORPHINE SULFATE (PF) 0.5 MG/ML IJ SOLN
INTRAMUSCULAR | Status: DC | PRN
Start: 2011-05-02 — End: 2011-05-02
  Administered 2011-05-02: 150 ug via INTRATHECAL

## 2011-05-02 MED ORDER — MEPERIDINE HCL 25 MG/ML IJ SOLN: 6.2500 mg | INTRAMUSCULAR | Status: DC | PRN

## 2011-05-02 MED ORDER — WITCH HAZEL-GLYCERIN EX PADS: 1.0000 "application " | MEDICATED_PAD | CUTANEOUS | Status: DC | PRN

## 2011-05-02 MED ORDER — SCOPOLAMINE 1 MG/3DAYS TD PT72
1.0000 | MEDICATED_PATCH | Freq: Once | TRANSDERMAL | Status: DC
  Administered 2011-05-02: 1.5 mg via TRANSDERMAL

## 2011-05-02 MED ORDER — ONDANSETRON HCL 4 MG/2ML IJ SOLN: 4.0000 mg | Freq: Three times a day (TID) | INTRAMUSCULAR | Status: DC | PRN

## 2011-05-02 MED ORDER — NALBUPHINE HCL 10 MG/ML IJ SOLN
5.0000 mg | INTRAMUSCULAR | Status: DC | PRN
  Administered 2011-05-02: 10 mg via SUBCUTANEOUS
  Filled 2011-05-02: qty 1

## 2011-05-02 MED ORDER — LANOLIN HYDROUS EX OINT: 1.0000 "application " | TOPICAL_OINTMENT | CUTANEOUS | Status: DC | PRN

## 2011-05-02 MED ORDER — INFLUENZA VIRUS VACC SPLIT PF IM SUSP
0.5000 mL | INTRAMUSCULAR | Status: DC
  Filled 2011-05-02: qty 0.5

## 2011-05-02 MED ORDER — FENTANYL CITRATE 0.05 MG/ML IJ SOLN: 25.0000 ug | INTRAMUSCULAR | Status: DC | PRN

## 2011-05-02 MED ORDER — OXYTOCIN 20 UNITS IN LACTATED RINGERS INFUSION - SIMPLE
INTRAVENOUS | Status: AC
  Filled 2011-05-02: qty 1000

## 2011-05-02 MED ORDER — METOCLOPRAMIDE HCL 5 MG/ML IJ SOLN: 10.0000 mg | Freq: Three times a day (TID) | INTRAMUSCULAR | Status: DC | PRN

## 2011-05-02 MED ORDER — HYDROMORPHONE HCL 2 MG PO TABS
2.0000 mg | ORAL_TABLET | ORAL | Status: DC | PRN
  Administered 2011-05-03: 4 mg via ORAL
  Administered 2011-05-03 – 2011-05-04 (×4): 2 mg via ORAL
  Administered 2011-05-04: 4 mg via ORAL
  Filled 2011-05-02 (×2): qty 2
  Filled 2011-05-02 (×3): qty 1
  Filled 2011-05-02: qty 2
  Filled 2011-05-02: qty 1

## 2011-05-02 MED ORDER — DIPHENHYDRAMINE HCL 25 MG PO CAPS: 25.0000 mg | ORAL_CAPSULE | Freq: Four times a day (QID) | ORAL | Status: DC | PRN

## 2011-05-02 MED ORDER — DIPHENHYDRAMINE HCL 50 MG/ML IJ SOLN: 12.5000 mg | INTRAMUSCULAR | Status: DC | PRN

## 2011-05-02 MED ORDER — DIPHENHYDRAMINE HCL 25 MG PO CAPS: 25.0000 mg | ORAL_CAPSULE | ORAL | Status: DC | PRN

## 2011-05-02 MED ORDER — DIPHENHYDRAMINE HCL 50 MG/ML IJ SOLN: 25.0000 mg | INTRAMUSCULAR | Status: DC | PRN

## 2011-05-02 MED ORDER — LORATADINE 10 MG PO TABS
10.0000 mg | ORAL_TABLET | Freq: Every day | ORAL | Status: DC | PRN
  Filled 2011-05-02: qty 1

## 2011-05-02 MED ORDER — SIMETHICONE 80 MG PO CHEW: 80.0000 mg | CHEWABLE_TABLET | ORAL | Status: DC | PRN

## 2011-05-02 SURGICAL SUPPLY — 32 items
BENZOIN TINCTURE PRP APPL 2/3 (GAUZE/BANDAGES/DRESSINGS) ×2 IMPLANT
CHLORAPREP W/TINT 26ML (MISCELLANEOUS) ×2 IMPLANT
CLOTH BEACON ORANGE TIMEOUT ST (SAFETY) ×2 IMPLANT
CONTAINER PREFILL 10% NBF 15ML (MISCELLANEOUS) ×4 IMPLANT
DRSG COVADERM 4X10 (GAUZE/BANDAGES/DRESSINGS) ×2 IMPLANT
ELECT REM PT RETURN 9FT ADLT (ELECTROSURGICAL) ×2
ELECTRODE REM PT RTRN 9FT ADLT (ELECTROSURGICAL) ×1 IMPLANT
EXTRACTOR VACUUM M CUP 4 TUBE (SUCTIONS) IMPLANT
GLOVE BIO SURGEON STRL SZ7.5 (GLOVE) ×4 IMPLANT
GLOVE BIOGEL PI IND STRL 7.5 (GLOVE) ×1 IMPLANT
GLOVE BIOGEL PI INDICATOR 7.5 (GLOVE) ×1
GOWN PREVENTION PLUS LG XLONG (DISPOSABLE) ×6 IMPLANT
KIT ABG SYR 3ML LUER SLIP (SYRINGE) IMPLANT
NS IRRIG 1000ML POUR BTL (IV SOLUTION) ×2 IMPLANT
PACK C SECTION WH (CUSTOM PROCEDURE TRAY) ×2 IMPLANT
RETRACTOR WND ALEXIS 25 LRG (MISCELLANEOUS) ×1 IMPLANT
RTRCTR WOUND ALEXIS 25CM LRG (MISCELLANEOUS) ×2
SLEEVE SCD COMPRESS KNEE MED (MISCELLANEOUS) IMPLANT
STRIP CLOSURE SKIN 1/2X4 (GAUZE/BANDAGES/DRESSINGS) ×2 IMPLANT
SUT CHROMIC 2 0 CT 1 (SUTURE) ×2 IMPLANT
SUT MNCRL AB 3-0 PS2 27 (SUTURE) ×2 IMPLANT
SUT PLAIN 0 NONE (SUTURE) ×2 IMPLANT
SUT PLAIN 2 0 XLH (SUTURE) ×2 IMPLANT
SUT VIC AB 0 CT1 36 (SUTURE) ×4 IMPLANT
SUT VIC AB 0 CTX 36 (SUTURE) ×4
SUT VIC AB 0 CTX36XBRD ANBCTRL (SUTURE) ×4 IMPLANT
SUT VIC AB 3-0 SH 27 (SUTURE) ×2
SUT VIC AB 3-0 SH 27XBRD (SUTURE) ×2 IMPLANT
SYR CONTROL 10ML LL (SYRINGE) IMPLANT
TOWEL OR 17X24 6PK STRL BLUE (TOWEL DISPOSABLE) ×4 IMPLANT
TRAY FOLEY CATH 14FR (SET/KITS/TRAYS/PACK) ×2 IMPLANT
WATER STERILE IRR 1000ML POUR (IV SOLUTION) ×2 IMPLANT

## 2011-05-02 NOTE — Anesthesia Postprocedure Evaluation (Signed)
  Anesthesia Post-op Note  Patient: [name]  Procedure(s) Performed: Procedure(s) (LRB): CESAREAN SECTION WITH BILATERAL TUBAL LIGATION (N/A)  Patient Location: PACU  Anesthesia Type: Spinal  Level of Consciousness: awake, alert  and oriented  Airway and Oxygen Therapy: Patient Spontanous Breathing  Post-op Pain: none  Post-op Assessment: Post-op Vital signs reviewed, Patient's Cardiovascular Status Stable, Respiratory Function Stable, Patent Airway, No signs of Nausea or vomiting and Adequate PO intake  Post-op Vital Signs: Reviewed and stable  Complications: No apparent anesthesia complications

## 2011-05-02 NOTE — Op Note (Signed)
Cesarean Section Procedure Note  Indications: 1.39 1/7wks 2.Repeat C-Section 3.Desires Permanent Sterilization  Pre-operative Diagnosis: Previous Cesarean Section, Desires Sterilization   Post-operative Diagnosis: Previous Cesarean Section, Desires Sterilization  Procedure: CESAREAN SECTION WITH BILATERAL TUBAL LIGATION CESAREAN SECTION WITH BILATERAL TUBAL LIGATION  Surgeon: Osborn Coho, MD    Assistants: Denny Levy, CNM  Anesthesia: Regional  Anesthesiologist: Tyrone Apple. Malen Gauze, MD   Procedure Details  The patient was taken to the operating room after the risks, benefits, complications, treatment options, and expected outcomes were discussed with the patient.  The patient concurred with the proposed plan, giving informed consent which was signed and witnessed. The patient was taken to Operating Room 1, identified as [name] and the procedure verified as C-Section Delivery. A Time Out was held and the above information confirmed.  After induction of anesthesia by obtaining a surgical level via a spinal, the patient was prepped and draped in the usual sterile manner. A Pfannenstiel skin incision was made and carried down through the subcutaneous tissue to the underlying layer of fascia.  The fascia was incised bilaterally and extended transversely bilaterally with the Mayo scissors. Kocher clamps were placed on the inferior aspect of the fascial incision and the underlying rectus muscle was separated from the fascia. The same was done on the superior aspect of the fascial incision.  The peritoneum was identified, entered sharply and extended sharply and manually. The utero-vesical peritoneal reflection was incised transversely and the bladder flap was bluntly freed from the lower uterine segment. A low transverse uterine incision was made with the scalpel and extended bilaterally with the bandage scissors.  The infant was delivered in vertex position without difficulty.  After the umbilical cord was clamped and cut, the infant was handed to the awaiting pediatricians.  Cord blood was obtained for evaluation.  The placenta was removed intact and appeared to be within normal limits. The uterus was cleared of all clots and debris. The uterine incision was closed with running interlocking sutures of 0 Vicryl and a second imbricating layer was performed as well.   Bilateral tubes and ovaries appeared to be within normal limits.  Good hemostasis was noted.  Copious irrigation was performed until clear.  The right fallopian tube was grasped in the midportion with a babcock after carrying it out to its fimbriated end and ligated with two 2-0 plain ties after taking down an adhesion with the bovie from the uterus to the anterior abdominal wall on the ipsilateral side.  The tube was excised and the remaining pedicle cauterized with the bovie. The same was done on the contralateral side.  The peritoneum was repaired with 2-0 chromic via a running suture.  The fascia was reapproximated with a running suture of 0 Vicryl. The subcutaneous tissue was reapproximated with 3 interrupted sutures of 2-0 plain.  The skin was reapproximated with a subcuticular suture of 3-0 monocryl.  Steristrips were applied.  Instrument, sponge, and needle counts were correct prior to abdominal closure and at the conclusion of the case.  The patient was awaiting transfer to the recovery room in good condition.  Findings: Live female infant with Apgars 9 at one minute and 9 at five minutes.  Normal appearing bilateral ovaries and fallopian tubes were noted. Weight 8lbs 7oz.  Estimated Blood Loss:  600 ml         Drains: foley to gravity 200  ml         Total IV Fluids: 2700 ml  Specimens to Pathology: Placenta and Bilateral Portions of Fallopian Tubes         Complications:  None; patient tolerated the procedure well.         Disposition: PACU - hemodynamically stable.         Condition:  stable  Attending Attestation: I performed the procedure.

## 2011-05-02 NOTE — Transfer of Care (Signed)
Immediate Anesthesia Transfer of Care Note  Patient: [name]  Procedure(s) Performed: Procedure(s) (LRB): CESAREAN SECTION WITH BILATERAL TUBAL LIGATION (N/A)  Patient Location: PACU  Anesthesia Type: Spinal  Level of Consciousness: awake  Airway & Oxygen Therapy: Patient Spontanous Breathing  Post-op Assessment: Report given to PACU RN  Post vital signs: Reviewed and stable  Complications: No apparent anesthesia complications

## 2011-05-02 NOTE — Anesthesia Procedure Notes (Signed)
Spinal  Patient location during procedure: OR Start time: 05/02/2011 2:48 PM Staffing Anesthesiologist: Thaddeaus Monica A. Performed by: anesthesiologist  Preanesthetic Checklist Completed: patient identified, site marked, surgical consent, pre-op evaluation, timeout performed, IV checked, risks and benefits discussed and monitors and equipment checked Spinal Block Patient position: sitting Prep: site prepped and draped and DuraPrep Patient monitoring: heart rate, cardiac monitor, continuous pulse ox and blood pressure Approach: midline Location: L3-4 Injection technique: single-shot Needle Needle type: Sprotte  Needle gauge: 24 G Needle length: 9 cm Needle insertion depth: 5 cm Assessment Sensory level: T4 Events: paresthesia Additional Notes Patient tolerated procedure well. Transient paresthesia perineum. Adequate sensory level.

## 2011-05-02 NOTE — H&P (Signed)
[name] is a 36 y.o.married hispanic/white female presenting for scheduled repeat c/s at 39.1 weeks per [facility] 05/08/11.  Denies any LOF, VB, UTI, or PIH s/s.  No recent illness.  Normal CBC and negative MRSA screen on 04/26/11.  Onset of care at [facility] at 32 weeks, transferring from [facility]; received letter of her dismissal from that practice effective 02/12/11.  Patient's prenatal care has been overall uncomplicated.  She reported progesterone supplement in 1st trimester secondary to h/o SABx2 w/ her 3rd and 4th pregancies.  Anatomy scan at [facility] showed LVEIF, however f/u u/s for S>D showed normal growth, consistent w/ dates at 33 weeks and foci no longer seen.  Negative Quad screen.  Daily meds=PNV and prn Zantac.  Patient desires tubal ligation, and 30-day papers are on [facility]. OB history G1=c/s in 2004, "urgent" for "cord":  Female=7+ lbs at 38 wks G2=repeat c/s in 2006:  Female=8+ lbs at 38 weeks G3=SAB in 2009 G4=SAB in 2010 G5=current pregnancy  Maternal Medical History:  Contractions: Frequency: rare.   Perceived severity is mild.    Fetal activity: Perceived fetal activity is normal.   Last perceived fetal movement was within the past hour.    Prenatal complications: 1.  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  Previous c/s x2 3.  H/o fibroids 4.  H/o cluster headaches 5.  LVEIF on anatomy scan at [facility]; not seen on 3rd trimester f/u u/s at [facility] 6.  H/o anemia 7.  Increased BMI 8.  Dismissed from [facility] 02/12/11  9.  Transfer of care to [facility] at 32 weeks    OB History    Grav Para Term Preterm Abortions TAB SAB Ect Mult Living   5 2 2  2  2   2      Past Medical History  Diagnosis Date  . Depression   . Anxiety   . Anemia   . Heart burn     With pregnancy   Past Surgical History  Procedure Date  . Cesarean section    Family History: mom-h/o anemia; MGM:  RA; PGF & PGM:  Emphysema; mental illness:  P. Aunt & PGM.   Social History:  reports that  she has quit smoking. She does not have any smokeless tobacco history on file. She reports that she does not drink alcohol or use illicit drugs. 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  Stopped smoking w/ positive UPT, but does admit to occasional "puff."    Review of Systems  Constitutional: Negative.   HENT: Negative.   Eyes: Negative.   Respiratory: Negative.   Cardiovascular: Negative.   Gastrointestinal: Positive for heartburn and diarrhea.       Loose stools recently; last BM yesterday; Takes Zantac intermittently for indigestion  Genitourinary: Negative.   Skin: Negative.   Neurological: Negative.       Blood pressure 96/68, pulse 90, temperature 98.4 F (36.9 C), temperature source Oral, resp. rate 18, last menstrual period 08/09/2010, SpO2 99.00%. Maternal Exam:  Uterine Assessment: Contraction strength is mild.  Contraction frequency is rare.   Abdomen: Patient reports no abdominal tenderness. Surgical scars: low transverse.   Fetal presentation: vertex  Introitus: not evaluated.     Fetal Exam Fetal Monitor Review: Mode: hand-held doppler probe.   Baseline rate: 147.      Physical Exam  Constitutional: She is oriented to person, place, and time. She appears well-developed and well-nourished. No distress.  HENT:  Head: Normocephalic  and atraumatic.  Neck: Normal range of motion.  Cardiovascular: Normal rate and regular rhythm.   Respiratory: Effort normal.  GI: Soft. Bowel sounds are normal.       gravid  Genitourinary:       Deferred pelvic exam  Musculoskeletal:       Trace generalized BLE edema  Neurological: She is alert and oriented to person, place, and time. She has normal reflexes.       No clonus  Skin: Skin is warm and dry. She is not diaphoretic.  Psychiatric: She has a normal mood and affect. Her behavior is normal. Thought content normal.    Prenatal labs: ABO, Rh: --/--/O POS (02/14 1108) Antibody: PENDING  (02/14 1108) Rubella: Immune (07/06 0000) RPR: NON REACTIVE (02/08 1159)  HBsAg: Negative (07/06 0000)  HIV:   nonreactive GBS:   done 04/11/11--unknown result Quad screen negative 1hr gtt--WNL  Assessment/Plan: 1.  IUP at 39.1 weeks 2.  Previous c/s x2 3.  Desires BTL 4.  H/o depression-no current trx or s/s 5.  Transfer of care at 32 weeks   1.  Admit to [facility] w/ Dr. Su Hilt as attending 2. Routine preop orders 3.  Repeat c/s recommended for delivery secondary to h/o previous c/s x2; r/b/a of c/s rev'c, including, but not limited to, infection, bleeding, damage to surrounding organs, and anesthesia complications.  Pt verbalized understanding of risks and agreeable to proceed w/ repeat c/s and BTL. 4.  MD to follow.     STEELMAN,CANDICE H 05/02/2011, 11:55 AM  No change in H&P.  No change in plan of care. - AYR

## 2011-05-02 NOTE — Anesthesia Preprocedure Evaluation (Signed)
Anesthesia Evaluation  Patient identified by MRN, date of birth, ID band Patient awake    Reviewed: Allergy & Precautions, H&P , NPO status , Patient's Chart, lab work & pertinent test results  Airway Mallampati: III TM Distance: >3 FB Neck ROM: Full    Dental No notable dental hx. (+) Teeth Intact   Pulmonary Current Smoker,  clear to auscultation  Pulmonary exam normal       Cardiovascular neg cardio ROS Regular Normal    Neuro/Psych PSYCHIATRIC DISORDERS Anxiety Depression Negative Neurological ROS     GI/Hepatic negative GI ROS, Neg liver ROS,   Endo/Other  Negative Endocrine ROS  Renal/GU negative Renal ROS  Genitourinary negative   Musculoskeletal negative musculoskeletal ROS (+)   Abdominal Normal abdominal exam  (+)   Peds  Hematology negative hematology ROS (+)   Anesthesia Other Findings   Reproductive/Obstetrics (+) Pregnancy                           Anesthesia Physical Anesthesia Plan  ASA: II  Anesthesia Plan: Spinal   Post-op Pain Management:    Induction:   Airway Management Planned:   Additional Equipment:   Intra-op Plan:   Post-operative Plan:   Informed Consent: I have reviewed the patients History and Physical, chart, labs and discussed the procedure including the risks, benefits and alternatives for the proposed anesthesia with the patient or authorized representative who has indicated his/her understanding and acceptance.     Plan Discussed with: Anesthesiologist, CRNA and Surgeon  Anesthesia Plan Comments:         Anesthesia Quick Evaluation

## 2011-05-03 ENCOUNTER — Encounter (HOSPITAL_COMMUNITY)
Admission: AD | Disposition: A | Discharge status: Home / Self Care | Payer: Self-pay | Source: Ambulatory Visit | Attending: Obstetrics and Gynecology

## 2011-05-03 LAB — CBC
Hemoglobin: 11.3 g/dL — ABNORMAL LOW (ref 12.0–15.0)
MCHC: 32.8 g/dL (ref 30.0–36.0)
RDW: 15.2 % (ref 11.5–15.5)

## 2011-05-03 SURGERY — Surgical Case
Anesthesia: Regional | Laterality: Bilateral

## 2011-05-03 NOTE — Anesthesia Postprocedure Evaluation (Signed)
  Anesthesia Post-op Note  Patient: [name]  Procedure(s) Performed: Procedure(s) (LRB): CESAREAN SECTION WITH BILATERAL TUBAL LIGATION (N/A)  Patient Location: PACU and Mother/Baby  Anesthesia Type: Spinal  Level of Consciousness: awake, alert  and oriented  Airway and Oxygen Therapy: Patient Spontanous Breathing  Post-op Pain: mild  Post-op Assessment: Patient's Cardiovascular Status Stable and Respiratory Function Stable  Post-op Vital Signs: stable  Complications: No apparent anesthesia complications

## 2011-05-03 NOTE — Progress Notes (Addendum)
Subjective: Postpartum Day 1 Cesarean Delivery Patient reports tolerating PO, + flatus and no problems voiding, med for pain is working well, little bleeding   Objective: Vital signs in last 24 hours: Temp:  [97.5 F (36.4 C)-99 F (37.2 C)] 99 F (37.2 C) (02/15 0922) Pulse Rate:  [55-90] 80  (02/15 0922) Resp:  [16-20] 18  (02/15 0922) BP: (84-117)/(51-80) 94/56 mmHg (02/15 0922) SpO2:  [96 %-100 %] 96 % (02/15 0527) Weight:  [83.462 kg (184 lb)] 83.462 kg (184 lb) (02/14 1800)  Physical Exam:  General: alert, cooperative and no distress Lochia: appropriate Uterine Fundus: firm Incision: covered with dressing with 1 cm old blood DVT Evaluation: Negative Homan's sign. Lungs clear bilaterally, ap regular, no blood on peri pad Bowel sounds hypoactive  Basename 05/03/11 0540  HGB 11.3*  HCT 34.5*    Assessment/Plan: Status post Cesarean section. Doing well postoperatively.  Continue current care.  Nakima Fluegge 05/03/2011, 10:06 AM

## 2011-05-03 NOTE — Progress Notes (Signed)
UR Chart review completed.  

## 2011-05-03 NOTE — Addendum Note (Signed)
Addendum  created 05/03/11 1036 by Kushi Kun, CRNA   Modules edited:Notes Section    

## 2011-05-03 NOTE — Progress Notes (Signed)
RN notified Np on call about patient complaining of pain after ambulating in the hall. Patient was given 2 mg of Dilaudid PO. After an hour patient notify RN about the pain increasing to a 8 out of 10. RN gave patient another 2 mg of Dilaudid with a 600 mg of Motrin. After an hour the patient complain of the pain not resolving and that she would like something stronger. Rn notify NP on call no new orders was given. Rn will give the patient a heating pad to help resolve the pain and recheck in an hour.Rn will monitor for now.

## 2011-05-04 MED ORDER — ACETAMINOPHEN-CODEINE #3 300-30 MG PO TABS
1.0000 | ORAL_TABLET | ORAL | Status: DC | PRN
  Administered 2011-05-04 (×2): 1 via ORAL
  Administered 2011-05-04 – 2011-05-05 (×2): 2 via ORAL
  Administered 2011-05-05: 1 via ORAL
  Filled 2011-05-04 (×2): qty 1
  Filled 2011-05-04 (×4): qty 2

## 2011-05-04 NOTE — Progress Notes (Signed)
Subjective: Postpartum Day 2: Cesarean Delivery Patient reports incisional pain, tolerating PO and + flatus.  One episode where thought bladder empty, and after stood up, had to void more and sat back down to further empty bladder.  Reports dilaudid helps w/ pain and she is able to get up and ambulate, but "doesn't last long."  Pt has prescriptions at home for "Tylenol #3 and Flexeril" for migraines, and typically takes one Tylenol #3 and one Flexeril and "goes to sleep."  Breastfeeding well.  VB light.  Objective: Vital signs in last 24 hours: Temp:  [97.2 F (36.2 C)-98.6 F (37 C)] 98.5 F (36.9 C) (02/16 0530) Pulse Rate:  [65-99] 65  (02/16 0530) Resp:  [18] 18  (02/16 0530) BP: (92-106)/(55-68) 98/55 mmHg (02/16 0530)  Physical Exam:  General: alert, cooperative, fatigued and no distress Lochia: appropriate Uterine Fundus: firm Incision: healing well, no significant drainage DVT Evaluation: No evidence of DVT seen on physical exam. Negative Homan's sign. No significant calf/ankle edema.   Basename 05/03/11 0540  HGB 11.3*  HCT 34.5*    Assessment/Plan: Status post Cesarean section. Postoperative course complicated by adequate pain managment secondary to narcotic allergies. Lactating.  Per c/w Dr. Pennie Rushing, and pt discussion, will d/c dilaudid now and switch to Tylenol #3 per pt preference to attempt better pain control for pt. Continue current care.  [name] H 05/04/2011, 9:51 AM

## 2011-05-05 DIAGNOSIS — Z98891 History of uterine scar from previous surgery: Secondary | ICD-10-CM

## 2011-05-05 HISTORY — DX: History of uterine scar from previous surgery: Z98.891

## 2011-05-05 LAB — CBC
HCT: 36.4 % (ref 36.0–46.0)
MCH: 27.8 pg (ref 26.0–34.0)
MCHC: 33 g/dL (ref 30.0–36.0)
MCV: 84.3 fL (ref 78.0–100.0)
Platelets: 188 10*3/uL (ref 150–400)
RDW: 15.1 % (ref 11.5–15.5)

## 2011-05-05 LAB — COMPREHENSIVE METABOLIC PANEL
ALT: 13 U/L (ref 0–35)
AST: 17 U/L (ref 0–37)
Albumin: 2.3 g/dL — ABNORMAL LOW (ref 3.5–5.2)
Alkaline Phosphatase: 84 U/L (ref 39–117)
Potassium: 4 mEq/L (ref 3.5–5.1)
Sodium: 137 mEq/L (ref 135–145)
Total Protein: 6 g/dL (ref 6.0–8.3)

## 2011-05-05 LAB — DIFFERENTIAL
Basophils Absolute: 0 10*3/uL (ref 0.0–0.1)
Eosinophils Absolute: 0.3 10*3/uL (ref 0.0–0.7)
Eosinophils Relative: 3 % (ref 0–5)
Monocytes Absolute: 0.6 10*3/uL (ref 0.1–1.0)

## 2011-05-05 MED ORDER — IBUPROFEN 600 MG PO TABS: 600.0000 mg | ORAL_TABLET | Freq: Four times a day (QID) | ORAL | Status: AC | PRN

## 2011-05-05 MED ORDER — ACETAMINOPHEN-CODEINE #3 300-30 MG PO TABS: 1.0000 | ORAL_TABLET | ORAL | Status: AC | PRN

## 2011-05-05 NOTE — Progress Notes (Signed)
2055-Called CNM on call, French Ana, and informed her of pt. c/o incisional pain rated 8 on an 8-10 pain scale. Pt. stated the Tylenol#3 that she took earlier was not easing her pain. Pt. had only been taking 1 Tylenol#3 tab vs. the max of 2 tabs as recommended per order. Hillary came to [facility] to assess the pt. & orders were given to give pt. another Tylenol#3, continue Motrin as ordered, & at next dosage time for Tylenol#3 start giving pt. 2 Tylenol#3 tabs & assess for pain control. Orders carried out per [facility], will continue to monitor.

## 2011-05-05 NOTE — Progress Notes (Addendum)
Patient ID: [name], female   DOB: 1975/04/10, 36 y.o.   MRN: [id number] Subjective: Postpartum Day 3: Cesarean Delivery Patient denies N/V. Tol PO well, voiding well, sometimes feels urge to void again immediately after emptying bladder, states she had this sx before pregnancy as well, denies any dysuria. +Flatus, no BM  up ad lib without syncope Pain well controlled with po meds,now using 2 tabs tylenol #3, as well as  Motrin, C/O of RUQ pain just below R brst and mid sternal pain, doesn't seem to be associated with eating, states she had the same pain at the end of pregnancy but it went away and now it's back since yesterday.  Slightly tender to touch mid-sternum.  Working on [employer], baby doesn't latch well, may have tight frenulum, c/o feeling "full" today, some nipple tenderness, lactation has seen pt, enc pt to f/u with peds/lactation for continued eval Mood stable, husband states she c/o feeling "sad" yesterday, she denies today,  bonding well, [facility]   Objective: Vital signs in last 24 hours: Temp:  [98.4 F (36.9 C)-99.1 F (37.3 C)] 98.5 F (36.9 C) (02/17 0601) Pulse Rate:  [76-86] 80  (02/17 0601) Resp:  [18-19] 18  (02/17 0601) BP: (96-104)/(54-65) 96/54 mmHg (02/17 0601)  Physical Exam:  General: alert and no distress Breasts: filling, nipples intact Heart: RRR Lungs: CTAB Abdomen: BS x4, feels slightly distended, mid-sternum tender to touch Uterine Fundus: firm Incision: healing well, no significant drainage, steri-strips intacts, clean and dry Lochia: appropriate DVT Evaluation: No evidence of DVT seen on physical exam. Negative Homan's sign. No significant calf/ankle edema.   Basename 05/03/11 0540  HGB 11.3*  HCT 34.5*    Assessment/Plan: Status post Cesarean section. Doing well postoperatively.  Slight anemia - stable. RUQ and mid-sternum pain - will draw CMET, lipase and amylase and repeat CBC If labs are normal will D/C home and f/u if  pain does not resolve  Lactating - pt will consult with peds/lactation re: latch and possible tight frenulum   [name] M 05/05/2011, 10:14 AM  Addendum: at 1229 Results for orders placed during the hospital encounter of 05/02/11 (from the past 24 hour(s))  COMPREHENSIVE METABOLIC PANEL     Status: Abnormal   Collection Time   05/05/11 10:25 AM      Component Value Range   Sodium 137  135 - 145 (mEq/L)   Potassium 4.0  3.5 - 5.1 (mEq/L)   Chloride 102  96 - 112 (mEq/L)   CO2 27  19 - 32 (mEq/L)   Glucose, Bld 81  70 - 99 (mg/dL)   BUN 8  6 - 23 (mg/dL)   Creatinine, Ser 3.08  0.50 - 1.10 (mg/dL)   Calcium 9.6  8.4 - 65.7 (mg/dL)   Total Protein 6.0  6.0 - 8.3 (g/dL)   Albumin 2.3 (*) 3.5 - 5.2 (g/dL)   AST 17  0 - 37 (U/L)   ALT 13  0 - 35 (U/L)   Alkaline Phosphatase 84  39 - 117 (U/L)   Total Bilirubin 0.1 (*) 0.3 - 1.2 (mg/dL)   GFR calc non Af Amer >90  >90 (mL/min)   GFR calc Af Amer >90  >90 (mL/min)  LIPASE, BLOOD     Status: Normal   Collection Time   05/05/11 10:25 AM      Component Value Range   Lipase 36  11 - 59 (U/L)  AMYLASE     Status: Normal   Collection Time  05/05/11 10:25 AM      Component Value Range   Amylase 46  0 - 105 (U/L)  CBC     Status: Normal   Collection Time   05/05/11 10:25 AM      Component Value Range   WBC 8.3  4.0 - 10.5 (K/uL)   RBC 4.32  3.87 - 5.11 (MIL/uL)   Hemoglobin 12.0  12.0 - 15.0 (g/dL)   HCT 16.1  09.6 - 04.5 (%)   MCV 84.3  78.0 - 100.0 (fL)   MCH 27.8  26.0 - 34.0 (pg)   MCHC 33.0  30.0 - 36.0 (g/dL)   RDW 40.9  81.1 - 91.4 (%)   Platelets 188  150 - 400 (K/uL)  DIFFERENTIAL     Status: Normal   Collection Time   05/05/11 10:25 AM      Component Value Range   Neutrophils Relative 71  43 - 77 (%)   Neutro Abs 5.9  1.7 - 7.7 (K/uL)   Lymphocytes Relative 19  12 - 46 (%)   Lymphs Abs 1.6  0.7 - 4.0 (K/uL)   Monocytes Relative 7  3 - 12 (%)   Monocytes Absolute 0.6  0.1 - 1.0 (K/uL)   Eosinophils Relative 3   0 - 5 (%)   Eosinophils Absolute 0.3  0.0 - 0.7 (K/uL)   Basophils Relative 0  0 - 1 (%)   Basophils Absolute 0.0  0.0 - 0.1 (K/uL)   Lab values normal Will D/C home Pt to f/u if pain worsens or has N/V.   [name], CNM

## 2011-05-05 NOTE — Discharge Instructions (Signed)
Iron-Rich Diet An iron-rich diet contains foods that are good sources of iron. Iron is an important mineral that helps your body produce hemoglobin. Hemoglobin is a protein in red blood cells that carries oxygen to the body's tissues. Sometimes, the iron level in your blood can be low. This may be caused by:  A lack of iron in your diet.   Blood loss.   Times of growth, such as during pregnancy or during a child's growth and development.  Low levels of iron can cause a decrease in the number of red blood cells. This can result in iron deficiency anemia. Iron deficiency anemia symptoms include:  Tiredness.   Weakness.   Irritability.   Increased chance of infection.  Here are some recommendations for daily iron intake:  Males older than 35 years of age need 8 mg of iron per day.   Women ages 19 to 50 need 18 mg of iron per day.   Pregnant women need 27 mg of iron per day, and women who are over 19 years of age and breastfeeding need 9 mg of iron per day.   Women over the age of 50 need 8 mg of iron per day.  SOURCES OF IRON There are 2 types of iron that are found in food: heme iron and nonheme iron. Heme iron is absorbed by the body better than nonheme iron. Heme iron is found in meat, poultry, and fish. Nonheme iron is found in grains, beans, and vegetables. Heme Iron Sources Food / Iron (mg)  Chicken liver, 3 oz (85 g)/ 10 mg   Beef liver, 3 oz (85 g)/ 5.5 mg   Oysters, 3 oz (85 g)/ 8 mg   Beef, 3 oz (85 g)/ 2 to 3 mg   Shrimp, 3 oz (85 g)/ 2.8 mg   Turkey, 3 oz (85 g)/ 2 mg   Chicken, 3 oz (85 g) / 1 mg   Fish (tuna, halibut), 3 oz (85 g)/ 1 mg   Pork, 3 oz (85 g)/ 0.9 mg  Nonheme Iron Sources Food / Iron (mg)  Ready-to-eat breakfast cereal, iron-fortified / 3.9 to 7 mg   Tofu,  cup / 3.4 mg   Kidney beans,  cup / 2.6 mg   Baked potato with skin / 2.7 mg   Asparagus,  cup / 2.2 mg   Avocado / 2 mg   Dried peaches,  cup / 1.6 mg   Raisins,  cup  / 1.5 mg   Soy milk, 1 cup / 1.5 mg   Whole-wheat bread, 1 slice / 1.2 mg   Spinach, 1 cup / 0.8 mg   Broccoli,  cup / 0.6 mg  IRON ABSORPTION Certain foods can decrease the body's absorption of iron. Try to avoid these foods and beverages while eating meals with iron-containing foods:  Coffee.   Tea.   Fiber.   Soy.  Foods containing vitamin C can help increase the amount of iron your body absorbs from iron sources, especially from nonheme sources. Eat foods with vitamin C along with iron-containing foods to increase your iron absorption. Foods that are high in vitamin C include many fruits and vegetables. Some good sources are:  Fresh orange juice.   Oranges.   Strawberries.   Mangoes.   Grapefruit.   Red bell peppers.   Green bell peppers.   Broccoli.   Potatoes with skin.   Tomato juice.  Document Released: 10/16/2004 Document Revised: 11/14/2010 Document Reviewed: 08/23/2010 ExitCare Patient Information 2012 [employer]. 

## 2011-05-05 NOTE — Discharge Summary (Signed)
   Obstetric Discharge Summary Reason for Admission: scheduled Repeat C/S, and BTL  Prenatal Procedures: ultrasound Intrapartum Procedures: cesarean: low cervical, transverse and spinal Postpartum Procedures: none Complications-Operative and Postpartum: none  Temp:  [98.4 F (36.9 C)-98.5 F (36.9 C)] 98.5 F (36.9 C) (02/17 0601) Pulse Rate:  [76-80] 80  (02/17 0601) Resp:  [18] 18  (02/17 0601) BP: (96-99)/(54-65) 96/54 mmHg (02/17 0601) Hemoglobin  Date Value Range Status  05/05/2011 12.0  12.0-15.0 (g/dL) Final     HCT  Date Value Range Status  05/05/2011 36.4  36.0-46.0 (%) Final    Hospital Course:  Hospital Course: Admitted to pre-op. Delivery was performed by Dr Su Hilt without difficulty. Patient and baby tolerated the procedure without difficulty.  Infant to FTN. Mother and infant then had an uncomplicated postpartum course, except for difficulty with pain management secondary to narcotic allergies. She was switched from dilaudid to tylenol #3, and pain was better managed when taking 2 tablets. Mom was having some difficulty w BF, secondary to infant latch, pt was given resources and seen by lactation in the hospital. Mom's physical exam was WNL, and she was discharged home in stable condition. Contraception plan was BTL.  She received adequate benefit from po pain medications.  Discharge Diagnoses: Term Pregnancy-delivered, mild anemia   Discharge Information: Date: 05/05/2011 Activity: pelvic rest Diet: routine, Iron-rich Medications:  Medication List  As of 05/05/2011  5:23 PM   START taking these medications         acetaminophen-codeine 300-30 MG per tablet   Commonly known as: TYLENOL #3   Take 1-2 tablets by mouth every 4 (four) hours as needed.      ibuprofen 600 MG tablet   Commonly known as: ADVIL,MOTRIN   Take 1 tablet (600 mg total) by mouth every 6 (six) hours as needed for pain.         CONTINUE taking these medications         ferrous sulfate 325  (65 FE) MG tablet      loratadine 10 MG tablet   Commonly known as: CLARITIN      prenatal vitamin w/FE, FA 27-1 MG Tabs      ranitidine 75 MG tablet   Commonly known as: ZANTAC         STOP taking these medications         acetaminophen 325 MG tablet      FIORICET 50-325-40 MG per tablet          Where to get your medications    These are the prescriptions that you need to pick up.   You may get these medications from any pharmacy.         acetaminophen-codeine 300-30 MG per tablet   ibuprofen 600 MG tablet           Condition: stable Instructions: refer to practice specific booklet Discharge to: home Follow-up Information    Follow up with Purcell Nails, MD in 6 weeks. (If symptoms worsen as needed)    Contact information:   [address] [place] [address] [phone]          Newborn Data: Live born  Information for the patient's newborn:  Rusnak, Girl Licia [[id number]]  female ; APGAR ,9, 9  ; weight ; 8#7oz  Home with mother.  Amellia Panik M 05/05/2011, 5:23 PM

## 2011-05-21 ENCOUNTER — Encounter (INDEPENDENT_AMBULATORY_CARE_PROVIDER_SITE_OTHER): Payer: Medicaid Other | Admitting: Obstetrics and Gynecology

## 2011-05-21 DIAGNOSIS — R300 Dysuria: Secondary | ICD-10-CM

## 2011-06-13 ENCOUNTER — Ambulatory Visit (INDEPENDENT_AMBULATORY_CARE_PROVIDER_SITE_OTHER): Payer: Medicaid Other | Admitting: Obstetrics and Gynecology

## 2011-06-13 DIAGNOSIS — R002 Palpitations: Secondary | ICD-10-CM

## 2011-06-13 DIAGNOSIS — N390 Urinary tract infection, site not specified: Secondary | ICD-10-CM

## 2011-06-13 DIAGNOSIS — Z139 Encounter for screening, unspecified: Secondary | ICD-10-CM

## 2011-07-16 ENCOUNTER — Encounter: Payer: Self-pay | Admitting: Cardiovascular Disease

## 2011-07-16 ENCOUNTER — Ambulatory Visit (INDEPENDENT_AMBULATORY_CARE_PROVIDER_SITE_OTHER): Payer: Medicaid Other | Admitting: Cardiovascular Disease

## 2011-07-16 VITALS — BP 101/68 | HR 89 | Ht 62.0 in | Wt 170.8 lb

## 2011-07-16 DIAGNOSIS — R002 Palpitations: Secondary | ICD-10-CM

## 2011-07-16 NOTE — Patient Instructions (Signed)
Your physician recommends that you schedule a follow-up appointment as needed with Dr Cooper.    

## 2011-07-16 NOTE — Progress Notes (Signed)
HPI:  36 year-old woman presents for initial cardiac evaluation. She is 3 months post-partum and presented for her 6 week follow-up with her OB-GYN complaining of palpitations. She reports sudden onset of palpitations, chest pain, dyspnea, and a feeling of panic. These episodes occurred daily for some time, and now are much less frequent. They always occur at rest. She denies any exertional symptoms but admits to very little physical activity - she does not exercise.   She had similar symptoms after her second child was born and had an uneventful heart monitor at that point. She has no history of cardiac disease. She has not been hospitalized other than for childbirth. There is no family history of premature cardiac disease.  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 She drinks at least 2 cups of coffee daily and also drinks Pepsi. She does not drink alcohol or use illicit drugs or stimulants.  Outpatient Encounter Prescriptions as of 07/16/2011  Medication Sig Dispense Refill  . ferrous sulfate 325 (65 FE) MG tablet Take 325 mg by mouth daily with breakfast.      . loratadine (CLARITIN) 10 MG tablet Take 10 mg by mouth daily as needed. For allergies       . prenatal vitamin w/FE, FA (PRENATAL 1 + 1) 27-1 MG TABS Take 1 tablet by mouth daily.       . ranitidine (ZANTAC) 75 MG tablet Take 75 mg by mouth 2 (two) times daily as needed. For indigestion        Codeine; Percocet; and Latex  Past Medical History  Diagnosis Date  . Depression   . Anxiety   . Anemia   . Heart burn     With pregnancy    Past Surgical History  Procedure Date  . Cesarean section     History   Social History  . Marital Status: Married    Spouse Name: N/A    Number of Children: N/A  . Years of Education: N/A   Occupational History  . Not on file.   Social History Main Topics  . Smoking status: Former Games developer  . Smokeless tobacco: Not on file  . Alcohol Use: No  . Drug Use: No  . Sexually Active: Yes    Other Topics Concern  . Not on file   Social History Narrative  . No narrative on file    Family history: no premature CAD or heart failure  ROS: General: no fevers/chills/night sweats Eyes: no blurry vision, diplopia, or amaurosis ENT: no sore throat or hearing loss Resp: no cough, wheezing, or hemoptysis CV: no edema or palpitations GI: no abdominal pain, nausea, vomiting, diarrhea, or constipation GU: no dysuria, frequency, or hematuria Skin: no rash Neuro: no headache, numbness, tingling, or weakness of extremities Musculoskeletal: no joint pain or swelling Heme: no bleeding, DVT, or easy bruising Endo: no polydipsia or polyuria  BP 101/68  Pulse 89  Ht 5\' 2"  (1.575 m)  Wt 77.474 kg (170 lb 12.8 oz)  BMI 31.24 kg/m2  PHYSICAL EXAM: Pt is alert and oriented, WD, WN, in no distress. HEENT: normal Neck: JVP normal. Carotid upstrokes normal without bruits. No thyromegaly. Lungs: equal expansion, clear bilaterally CV: Apex is discrete and nondisplaced, RRR without murmur or gallop Abd: soft, NT, +BS, no bruit, no hepatosplenomegaly Back: no CVA tenderness Ext: no C/C/E        DP/PT pulses intact and = Skin: warm and dry without rash Neuro: CNII-XII intact  Strength intact = bilaterally  EKG:  NSR 79 bpm, nonspecific T wave abnormality - essentially within normal limits  ASSESSMENT AND PLAN:  Palpitations: I suspect the patient has benign palpitations. Her physical examination and 12-lead EKG are within normal limits. Her symptoms are improving. We discussed the importance of reducing caffeine intake. She has undergone previous outpatient telemetry monitoring and this has been unrevealing. Thyroid studies have been done and I presume they are normal as the patient has not been contacted about the results. I do not think she requires further cardiac workup at this point. She was reassured regarding her cardiac status. If she develops progressive palpitations  or other symptoms of chest pain or shortness of breath I asked her to contact me and we would proceed with further evaluation at that point.

## 2011-07-30 ENCOUNTER — Telehealth: Payer: Self-pay | Admitting: Obstetrics and Gynecology

## 2011-07-30 ENCOUNTER — Telehealth: Payer: Self-pay

## 2011-07-30 DIAGNOSIS — R002 Palpitations: Secondary | ICD-10-CM

## 2011-07-30 NOTE — Telephone Encounter (Signed)
Tc to pt per vph recs. Pt needs TSH testing due to palpitations. Appt sched 07-31-11@11 :45 with lab only. Pt agrees.

## 2011-07-31 ENCOUNTER — Other Ambulatory Visit: Payer: Medicaid Other

## 2011-08-05 ENCOUNTER — Encounter: Payer: Self-pay | Admitting: Obstetrics and Gynecology

## 2011-08-05 ENCOUNTER — Ambulatory Visit (INDEPENDENT_AMBULATORY_CARE_PROVIDER_SITE_OTHER): Payer: Medicaid Other | Admitting: Obstetrics and Gynecology

## 2011-08-05 VITALS — BP 96/64 | Resp 14 | Ht 62.0 in | Wt 167.0 lb

## 2011-08-05 DIAGNOSIS — Z139 Encounter for screening, unspecified: Secondary | ICD-10-CM

## 2011-08-05 DIAGNOSIS — R002 Palpitations: Secondary | ICD-10-CM

## 2011-08-05 NOTE — Progress Notes (Signed)
The patient presents today for a TAH secondary to history of palpitations and was seen by a cardiologist in April. While here she requested that her incision be checked. She occasionally feels being sensations at her incision site. She also reports occasional bilateral leg discomfort but denies any swelling or outright tenderness.  Filed Vitals:   08/05/11 1302  BP: 96/64  Resp: 14   Incision is well healed and there are no signs of erythema and isnontender. There is also no drainage. Extremities within normal limits  Assessment and plan Return to office in August for annual exam Labs today Incision is well-healed and precautions discussed

## 2011-08-06 LAB — CBC
MCH: 26.4 pg (ref 26.0–34.0)
MCHC: 33.6 g/dL (ref 30.0–36.0)
RDW: 15.5 % (ref 11.5–15.5)

## 2011-08-06 LAB — VITAMIN D 25 HYDROXY (VIT D DEFICIENCY, FRACTURES): Vit D, 25-Hydroxy: 48 ng/mL (ref 30–89)

## 2011-08-06 LAB — TSH: TSH: 1.05 u[IU]/mL (ref 0.350–4.500)

## 2011-10-09 ENCOUNTER — Other Ambulatory Visit: Payer: Self-pay | Admitting: Family Medicine

## 2011-10-09 DIAGNOSIS — R51 Headache: Secondary | ICD-10-CM

## 2011-10-14 ENCOUNTER — Ambulatory Visit
Admission: RE | Admit: 2011-10-14 | Discharge: 2011-10-14 | Disposition: A | Discharge status: Home / Self Care | Payer: Medicaid Other | Source: Ambulatory Visit | Attending: Family Medicine | Admitting: Family Medicine

## 2011-10-14 DIAGNOSIS — R51 Headache: Secondary | ICD-10-CM

## 2011-10-24 ENCOUNTER — Encounter: Payer: Self-pay | Admitting: Obstetrics and Gynecology

## 2011-10-24 ENCOUNTER — Telehealth: Payer: Self-pay | Admitting: Obstetrics and Gynecology

## 2011-10-24 NOTE — Telephone Encounter (Signed)
Tc to pt regarding msg, pt states has spoken with someone and has a letter waiting for her to pick up today.

## 2011-10-24 NOTE — Telephone Encounter (Signed)
TRIAGE/EPIC °

## 2012-08-17 ENCOUNTER — Other Ambulatory Visit (HOSPITAL_COMMUNITY)
Admission: RE | Admit: 2012-08-17 | Discharge: 2012-08-17 | Disposition: A | Discharge status: Home / Self Care | Payer: BC Managed Care – PPO | Source: Ambulatory Visit | Attending: Family Medicine | Admitting: Family Medicine

## 2012-08-17 ENCOUNTER — Other Ambulatory Visit: Payer: Self-pay | Admitting: Physician Assistant

## 2012-08-17 DIAGNOSIS — Z124 Encounter for screening for malignant neoplasm of cervix: Secondary | ICD-10-CM | POA: Insufficient documentation

## 2013-01-23 ENCOUNTER — Emergency Department (HOSPITAL_BASED_OUTPATIENT_CLINIC_OR_DEPARTMENT_OTHER)
Admission: EM | Admit: 2013-01-23 | Discharge: 2013-01-24 | Disposition: A | Discharge status: Home / Self Care | Payer: BC Managed Care – PPO | Attending: Emergency Medicine | Admitting: Emergency Medicine

## 2013-01-23 ENCOUNTER — Encounter (HOSPITAL_BASED_OUTPATIENT_CLINIC_OR_DEPARTMENT_OTHER): Payer: Self-pay | Admitting: Emergency Medicine

## 2013-01-23 DIAGNOSIS — Z91040 Latex allergy status: Secondary | ICD-10-CM | POA: Insufficient documentation

## 2013-01-23 DIAGNOSIS — A499 Bacterial infection, unspecified: Secondary | ICD-10-CM | POA: Insufficient documentation

## 2013-01-23 DIAGNOSIS — R197 Diarrhea, unspecified: Secondary | ICD-10-CM | POA: Insufficient documentation

## 2013-01-23 DIAGNOSIS — B9689 Other specified bacterial agents as the cause of diseases classified elsewhere: Secondary | ICD-10-CM | POA: Insufficient documentation

## 2013-01-23 DIAGNOSIS — Z79899 Other long term (current) drug therapy: Secondary | ICD-10-CM | POA: Insufficient documentation

## 2013-01-23 DIAGNOSIS — Z8742 Personal history of other diseases of the female genital tract: Secondary | ICD-10-CM | POA: Insufficient documentation

## 2013-01-23 DIAGNOSIS — R110 Nausea: Secondary | ICD-10-CM | POA: Insufficient documentation

## 2013-01-23 DIAGNOSIS — F17200 Nicotine dependence, unspecified, uncomplicated: Secondary | ICD-10-CM | POA: Insufficient documentation

## 2013-01-23 DIAGNOSIS — Z8659 Personal history of other mental and behavioral disorders: Secondary | ICD-10-CM | POA: Insufficient documentation

## 2013-01-23 DIAGNOSIS — Z862 Personal history of diseases of the blood and blood-forming organs and certain disorders involving the immune mechanism: Secondary | ICD-10-CM | POA: Insufficient documentation

## 2013-01-23 DIAGNOSIS — Z9851 Tubal ligation status: Secondary | ICD-10-CM | POA: Insufficient documentation

## 2013-01-23 DIAGNOSIS — Z3202 Encounter for pregnancy test, result negative: Secondary | ICD-10-CM | POA: Insufficient documentation

## 2013-01-23 DIAGNOSIS — N760 Acute vaginitis: Secondary | ICD-10-CM | POA: Insufficient documentation

## 2013-01-23 LAB — URINALYSIS, ROUTINE W REFLEX MICROSCOPIC
Bilirubin Urine: NEGATIVE
Glucose, UA: NEGATIVE mg/dL
Ketones, ur: NEGATIVE mg/dL
Leukocytes, UA: NEGATIVE
Nitrite: NEGATIVE
Protein, ur: NEGATIVE mg/dL

## 2013-01-23 LAB — WET PREP, GENITAL: Trich, Wet Prep: NONE SEEN

## 2013-01-23 MED ORDER — METRONIDAZOLE 0.75 % VA GEL
1.0000 | Freq: Every day | VAGINAL | Status: DC
  Filled 2013-01-23: qty 70

## 2013-01-23 MED ORDER — METRONIDAZOLE 0.75 % VA GEL: 1.0000 | Freq: Every day | VAGINAL | Status: DC

## 2013-01-23 NOTE — ED Notes (Signed)
MD at bedside. 

## 2013-01-23 NOTE — ED Notes (Signed)
Pelvic cart to bedside 

## 2013-01-23 NOTE — ED Notes (Signed)
C/o onset of nausea, dysuria, low abdominal pain, back pain x two weeks.  Denies fever, vomiting.  C/o some episodes of diarrhea.

## 2013-01-23 NOTE — ED Provider Notes (Signed)
CSN: [id number]     Arrival date & time 01/23/13  1954 History  This chart was scribed for Geoffery Lyons, MD by Dorothey Baseman, ED Scribe. This patient was seen in room MH01/MH01 and the patient's care was started at 9:51 PM.    Chief Complaint  Patient presents with  . Dysuria  . Abdominal Pain   The history is provided by the patient. No language interpreter was used.   HPI Comments: [name] is a 37 y.o. female who presents to the Emergency Department complaining of a constant, cramping pain to the lower abdomen that radiates to the back onset a little over 1 week ago with associated nausea, diarrhea, dysuria, and urinary frequency. Patient reports that she had a yeast infection last month that she treated at home with OTC medications. She denies fever, constipation, vaginal discharge or bleeding. Patient reports a history of tubal ligation and 3 cesarean sections. Patient also reports a history of fibroid cysts in the uterus, but denies history of ovarian cysts.  Past Medical History  Diagnosis Date  . Depression   . Anxiety   . Anemia   . Heart burn     With pregnancy   Past Surgical History  Procedure Laterality Date  . Cesarean section     No family history on file. History  Substance Use Topics  . Smoking status: Current Every Day Smoker  . Smokeless tobacco: Not on file  . Alcohol Use: No   OB History   Grav Para Term Preterm Abortions TAB SAB Ect Mult Living   5 3 3  2  2   2      Review of Systems  A complete 10 system review of systems was obtained and all systems are negative except as noted in the HPI and PMH.   Allergies  Codeine; Percocet; and Latex  Home Medications   Current Outpatient Rx  Name  Route  Sig  Dispense  Refill  . loratadine (CLARITIN) 10 MG tablet   Oral   Take 10 mg by mouth daily as needed. For allergies          . prenatal vitamin w/FE, FA (PRENATAL 1 + 1) 27-1 MG TABS   Oral   Take 1 tablet by mouth daily.           . ranitidine (ZANTAC) 75 MG tablet   Oral   Take 75 mg by mouth 2 (two) times daily as needed. For indigestion          Triage Vitals: BP 97/57  Pulse 77  Temp(Src) 98 F (36.7 C) (Oral)  Resp 20  Ht 5\' 2"  (1.575 m)  Wt 140 lb (63.504 kg)  BMI 25.60 kg/m2  SpO2 100%  LMP 01/12/2013  Breastfeeding? Yes  Physical Exam  Nursing note and vitals reviewed. Constitutional: She is oriented to person, place, and time. She appears well-developed and well-nourished. No distress.  HENT:  Head: Normocephalic and atraumatic.  Eyes: Conjunctivae are normal.  Neck: Normal range of motion. Neck supple.  Cardiovascular: Normal rate, regular rhythm and normal heart sounds.  Exam reveals no gallop and no friction rub.   No murmur heard. Pulmonary/Chest: Effort normal and breath sounds normal. No respiratory distress. She has no wheezes. She has no rales. She exhibits no tenderness.  Abdominal: Soft. She exhibits no distension. There is tenderness.  Mild tenderness to palpation to the suprapubic region.   Genitourinary:  No CVA tenderness to palpation. White-gray discharge present. No adnexal masses  or tenderness. No cervical motion tenderness.   Musculoskeletal: Normal range of motion.  Neurological: She is alert and oriented to person, place, and time.  Skin: Skin is warm and dry.  Psychiatric: She has a normal mood and affect. Her behavior is normal.    ED Course  Procedures (including critical care time)  DIAGNOSTIC STUDIES: Oxygen Saturation is 100% on room air, normal by my interpretation.    COORDINATION OF CARE: 9:54 PM- Discussed normal UA results. Will perform a pelvic exam. Discussed treatment plan with patient at bedside and patient verbalized agreement.     Labs Review Labs Reviewed  WET PREP, GENITAL - Abnormal; Notable for the following:    Clue Cells Wet Prep HPF POC FEW (*)    WBC, Wet Prep HPF POC FEW (*)    All other components within normal limits  URINE  CULTURE  GC/CHLAMYDIA PROBE AMP  URINALYSIS, ROUTINE W REFLEX MICROSCOPIC  PREGNANCY, URINE   Imaging Review No results found.  EKG Interpretation   None       MDM  No diagnosis found. Pelvic exam is unremarkable as is the urinalysis. There are clue cells and white cells on the wet prep which will be treated with MetroGel as the patient is breast-feeding. She is to return if she develops any new or bothersome symptoms. I do not believe this is appendicitis as there is no right lower quadrant tenderness to palpation  I personally performed the services described in this documentation, which was scribed in my presence. The recorded information has been reviewed and is accurate.       Geoffery Lyons, MD 01/23/13 [phone]

## 2013-01-24 MED ORDER — METRONIDAZOLE 500 MG PO TABS: 500.0000 mg | ORAL_TABLET | Freq: Two times a day (BID) | ORAL | Status: DC

## 2013-01-24 NOTE — ED Provider Notes (Signed)
Pt called and sts she cannot afford metrol gel as treatment for BV.  Pt currently breast feeding.  Pt may take metronidazole PO while breast feeding but must choose option to pump and discard milk for the duration of therapy and for 24 hrs after therapy ends and to feed her infant stored human milk or formula.  Instruction given to pt via [facility] staff.    Fayrene Helper, PA-C 01/24/13 1620

## 2013-01-24 NOTE — ED Provider Notes (Signed)
Medical screening examination/treatment/procedure(s) were performed by non-physician practitioner and as supervising physician I was immediately available for consultation/collaboration.  EKG Interpretation   None        Geoffery Lyons, MD 01/24/13 1928

## 2013-01-25 LAB — URINE CULTURE
Colony Count: NO GROWTH
Culture: NO GROWTH

## 2013-01-25 LAB — GC/CHLAMYDIA PROBE AMP: CT Probe RNA: NEGATIVE

## 2013-10-30 ENCOUNTER — Emergency Department (HOSPITAL_BASED_OUTPATIENT_CLINIC_OR_DEPARTMENT_OTHER)
Admission: EM | Admit: 2013-10-30 | Discharge: 2013-10-30 | Disposition: A | Discharge status: Home / Self Care | Payer: BC Managed Care – PPO | Attending: Emergency Medicine | Admitting: Emergency Medicine

## 2013-10-30 ENCOUNTER — Encounter (HOSPITAL_BASED_OUTPATIENT_CLINIC_OR_DEPARTMENT_OTHER): Payer: Self-pay | Admitting: Emergency Medicine

## 2013-10-30 DIAGNOSIS — F17200 Nicotine dependence, unspecified, uncomplicated: Secondary | ICD-10-CM | POA: Insufficient documentation

## 2013-10-30 DIAGNOSIS — Z91040 Latex allergy status: Secondary | ICD-10-CM | POA: Insufficient documentation

## 2013-10-30 DIAGNOSIS — Z792 Long term (current) use of antibiotics: Secondary | ICD-10-CM | POA: Insufficient documentation

## 2013-10-30 DIAGNOSIS — Z8659 Personal history of other mental and behavioral disorders: Secondary | ICD-10-CM | POA: Insufficient documentation

## 2013-10-30 DIAGNOSIS — Z862 Personal history of diseases of the blood and blood-forming organs and certain disorders involving the immune mechanism: Secondary | ICD-10-CM | POA: Insufficient documentation

## 2013-10-30 DIAGNOSIS — J029 Acute pharyngitis, unspecified: Secondary | ICD-10-CM | POA: Insufficient documentation

## 2013-10-30 LAB — RAPID STREP SCREEN (MED CTR MEBANE ONLY): Streptococcus, Group A Screen (Direct): NEGATIVE

## 2013-10-30 MED ORDER — HYDROCODONE-ACETAMINOPHEN 7.5-325 MG/15ML PO SOLN: 15.0000 mL | Freq: Four times a day (QID) | ORAL | Status: AC | PRN

## 2013-10-30 MED ORDER — IBUPROFEN 600 MG PO TABS: 600.0000 mg | ORAL_TABLET | Freq: Four times a day (QID) | ORAL | Status: AC | PRN

## 2013-10-30 NOTE — ED Notes (Signed)
Patient states she has "white stuff" on her throat. Painful and swollen

## 2013-10-30 NOTE — ED Provider Notes (Signed)
CSN: [id number]     Arrival date & time 10/30/13  1421 History   First MD Initiated Contact with Patient 10/30/13 1426     Chief Complaint  Patient presents with  . Sore Throat     (Consider location/radiation/quality/duration/timing/severity/associated sxs/prior Treatment) HPI Pt presents with c/o sore throat which began several days ago.  She noted white patches on her right tonsil yesterday.  No difficulty breathing or swallowing.  No fever. She has had some cough.  There are no other associated systemic symptoms, there are no other alleviating or modifying factors. Symptoms are constant.  She has not had any treatment prior to arrival.    Past Medical History  Diagnosis Date  . Depression   . Anxiety   . Anemia   . Heart burn     With pregnancy   Past Surgical History  Procedure Laterality Date  . Cesarean section    . Cesarean section     No family history on file. History  Substance Use Topics  . Smoking status: Current Every Day Smoker  . Smokeless tobacco: Not on file  . Alcohol Use: Yes     Comment: social   OB History   Grav Para Term Preterm Abortions TAB SAB Ect Mult Living   5 3 3  2  2   2      Review of Systems ROS reviewed and all otherwise negative except for mentioned in HPI    Allergies  Codeine; Percocet; and Latex  Home Medications   Prior to Admission medications   Medication Sig Start Date End Date Taking? Authorizing Provider  HYDROcodone-acetaminophen (HYCET) 7.5-325 mg/15 ml solution Take 15 mLs by mouth 4 (four) times daily as needed for moderate pain. 10/30/13 10/30/14  Ethelda ChickMartha K Linker, MD  ibuprofen (ADVIL,MOTRIN) 600 MG tablet Take 1 tablet (600 mg total) by mouth every 6 (six) hours as needed. 10/30/13   Ethelda ChickMartha K Linker, MD  loratadine (CLARITIN) 10 MG tablet Take 10 mg by mouth daily as needed. For allergies     Historical Provider, MD  metroNIDAZOLE (FLAGYL) 500 MG tablet Take 1 tablet (500 mg total) by mouth 2 (two) times daily.  01/24/13   Fayrene HelperBowie Tran, PA-C  prenatal vitamin w/FE, FA (PRENATAL 1 + 1) 27-1 MG TABS Take 1 tablet by mouth daily.     Historical Provider, MD  ranitidine (ZANTAC) 75 MG tablet Take 75 mg by mouth 2 (two) times daily as needed. For indigestion    Historical Provider, MD   BP 96/56  Pulse 81  Temp(Src) 98.9 F (37.2 C) (Oral)  Resp 18  Ht 5\' 3"  (1.6 m)  Wt 153 lb (69.4 kg)  BMI 27.11 kg/m2  SpO2 96%  LMP 10/12/2013  Breastfeeding? Yes Vitals reviewed Physical Exam Physical Examination: General appearance - alert, well appearing, and in no distress Mental status - alert, oriented to person, place, and time Eyes - no conjunctival injection, no scleral icterus Mouth - MMM, OP with moderate erythema, right tonsil with small amount of exudate, uvula midline, palate symmetric Neck - supple, no significant adenopathy Chest - clear to auscultation, no wheezes, rales or rhonchi, symmetric air entry Heart - normal rate, regular rhythm, normal S1, S2, no murmurs, rubs, clicks or gallops Extremities - peripheral pulses normal, no pedal edema, no clubbing or cyanosis Skin - normal coloration and turgor, no rashes  ED Course  Procedures (including critical care time)   Labs Review Labs Reviewed  RAPID STREP SCREEN  CULTURE, GROUP A  STREP    Imaging Review No results found.   EKG Interpretation None      MDM   Final diagnoses:  Pharyngitis    Pt presenting with c/o sore throat.  She does have erythematous OP with exudate.  No fever, she does have a cough.  No enlarged anterior cervical lymph nodes.  Centor criteria 1.  She had negative rapid strep, culture is pending.  I have explained the rationale of awaiting strep culture and if this is positive she will be contacted to be started on antibiotics.  I have given rx  For lortab solution and recommended ibuprofen.  Pt continues to be concerned that I am not giving abx rx- i have explained to the best of my ability the symptomatic  treatment I am providing now, the strep culture pending and that if she needs antibiotics she will be contacted and a prescription called in.  Discharged with strict return precautions.  Pt verbalizes understanding of the plan.    Ethelda Chick, MD 10/31/13 [phone]

## 2013-10-30 NOTE — Discharge Instructions (Signed)
Return to the ED with any concerns including difficulty breathing, difficulty swallowing, vomiting and not able to keep down liquids, decreased level of alertness

## 2013-11-01 LAB — CULTURE, GROUP A STREP

## 2014-01-17 ENCOUNTER — Encounter (HOSPITAL_BASED_OUTPATIENT_CLINIC_OR_DEPARTMENT_OTHER): Payer: Self-pay | Admitting: Emergency Medicine

## 2014-11-28 ENCOUNTER — Other Ambulatory Visit: Payer: Self-pay | Admitting: Physician Assistant

## 2015-06-07 ENCOUNTER — Other Ambulatory Visit: Payer: Self-pay | Admitting: Physician Assistant

## 2015-06-07 ENCOUNTER — Other Ambulatory Visit (HOSPITAL_COMMUNITY)
Admission: RE | Admit: 2015-06-07 | Discharge: 2015-06-07 | Disposition: A | Discharge status: Home / Self Care | Payer: Managed Care, Other (non HMO) | Source: Ambulatory Visit | Attending: Family Medicine | Admitting: Family Medicine

## 2015-06-07 DIAGNOSIS — Z124 Encounter for screening for malignant neoplasm of cervix: Secondary | ICD-10-CM | POA: Diagnosis not present

## 2015-06-12 LAB — CYTOLOGY - PAP

## 2016-02-12 ENCOUNTER — Other Ambulatory Visit: Payer: Self-pay | Admitting: Physician Assistant

## 2016-02-12 ENCOUNTER — Other Ambulatory Visit (HOSPITAL_BASED_OUTPATIENT_CLINIC_OR_DEPARTMENT_OTHER): Payer: Self-pay | Admitting: Physician Assistant

## 2016-02-12 DIAGNOSIS — R102 Pelvic and perineal pain: Secondary | ICD-10-CM

## 2016-02-13 ENCOUNTER — Other Ambulatory Visit: Payer: Self-pay | Admitting: Physician Assistant

## 2016-02-13 DIAGNOSIS — R102 Pelvic and perineal pain: Secondary | ICD-10-CM

## 2016-02-14 ENCOUNTER — Ambulatory Visit (HOSPITAL_BASED_OUTPATIENT_CLINIC_OR_DEPARTMENT_OTHER): Payer: Managed Care, Other (non HMO)

## 2016-02-16 ENCOUNTER — Ambulatory Visit
Admission: RE | Admit: 2016-02-16 | Discharge: 2016-02-16 | Disposition: A | Discharge status: Home / Self Care | Payer: Managed Care, Other (non HMO) | Source: Ambulatory Visit | Attending: Physician Assistant | Admitting: Physician Assistant

## 2016-02-16 DIAGNOSIS — R102 Pelvic and perineal pain: Secondary | ICD-10-CM

## 2016-09-16 DIAGNOSIS — Z90710 Acquired absence of both cervix and uterus: Secondary | ICD-10-CM | POA: Insufficient documentation

## 2018-09-30 ENCOUNTER — Other Ambulatory Visit: Payer: Self-pay | Admitting: Physician Assistant

## 2018-09-30 DIAGNOSIS — R102 Pelvic and perineal pain: Secondary | ICD-10-CM

## 2018-09-30 DIAGNOSIS — R1030 Lower abdominal pain, unspecified: Secondary | ICD-10-CM

## 2018-10-08 ENCOUNTER — Other Ambulatory Visit: Payer: Managed Care, Other (non HMO)

## 2018-10-21 ENCOUNTER — Other Ambulatory Visit: Payer: 59

## 2018-10-28 ENCOUNTER — Ambulatory Visit
Admission: RE | Admit: 2018-10-28 | Discharge: 2018-10-28 | Disposition: A | Discharge status: Home / Self Care | Payer: 59 | Source: Ambulatory Visit | Attending: Physician Assistant | Admitting: Physician Assistant

## 2018-10-28 DIAGNOSIS — R1030 Lower abdominal pain, unspecified: Secondary | ICD-10-CM

## 2018-10-28 DIAGNOSIS — R102 Pelvic and perineal pain: Secondary | ICD-10-CM

## 2019-02-16 ENCOUNTER — Ambulatory Visit
Admission: EM | Admit: 2019-02-16 | Discharge: 2019-02-16 | Disposition: A | Discharge status: Home / Self Care | Payer: 59 | Attending: Emergency Medicine | Admitting: Emergency Medicine

## 2019-02-16 DIAGNOSIS — Z20822 Contact with and (suspected) exposure to covid-19: Secondary | ICD-10-CM

## 2019-02-16 DIAGNOSIS — R519 Headache, unspecified: Secondary | ICD-10-CM

## 2019-02-16 DIAGNOSIS — Z20828 Contact with and (suspected) exposure to other viral communicable diseases: Secondary | ICD-10-CM

## 2019-02-16 DIAGNOSIS — R509 Fever, unspecified: Secondary | ICD-10-CM | POA: Diagnosis not present

## 2019-02-16 LAB — POC SARS CORONAVIRUS 2 AG -  ED: SARS Coronavirus 2 Ag: NEGATIVE

## 2019-02-16 NOTE — ED Triage Notes (Signed)
Pt presents with complaints of nasal drainage, cough, headache, body aches, fatigue and nausea since Friday. Reports she possibly could have been exposed to [place] on 11/22.

## 2019-02-16 NOTE — ED Provider Notes (Signed)
[facility]    CSN: [id number] Arrival date & time: 02/16/19  1004      History   Chief Complaint Chief Complaint  Patient presents with  . Nasal Congestion  . Headache    HPI [name] is a 43 y.o. female with history of anxiety, depression presenting for nasal drainage, cough, headache, myalgias, fatigue and nausea since Friday.  Think she may have been exposed to Covid on 11/22.  Cough has been dry, nonproductive, worse in the morning after postnasal drip.  States she has been sleeping with a fan on her which has made symptoms worse.  Taking OTC nasal decongestant with adequate relief of sinus pressure.  Also does saline nasal spray once daily.  Patient states nausea is "from my drainage ": Denies vomiting, decreased appetite.  Past Medical History:  Diagnosis Date  . Anemia   . Anxiety   . Depression   . Heart burn    With pregnancy    Patient Active Problem List   Diagnosis Date Noted  . Status post repeat low transverse cesarean section 05/05/2011  . Smoking history 05/02/2011  . History of depression 05/02/2011  . Cluster headaches 05/02/2011  . History of spontaneous abortion 05/02/2011    Past Surgical History:  Procedure Laterality Date  . CESAREAN SECTION    . CESAREAN SECTION      OB History    Gravida  5   Para  3   Term  3   Preterm      AB  2   Living  2     SAB  2   TAB      Ectopic      Multiple      Live Births  2            Home Medications    Prior to Admission medications   Medication Sig Start Date End Date Taking? Authorizing Provider  ibuprofen (ADVIL,MOTRIN) 600 MG tablet Take 1 tablet (600 mg total) by mouth every 6 (six) hours as needed. 10/30/13   Mabe, Latanya Maudlin, MD  loratadine (CLARITIN) 10 MG tablet Take 10 mg by mouth daily as needed. For allergies     [provider]  ranitidine (ZANTAC) 75 MG tablet Take 75 mg by mouth 2 (two) times daily as needed. For indigestion   02/16/19  [provider]    Family History Family History  Problem Relation Age of Onset  . Healthy Mother   . Healthy Father     Social History Social History   Tobacco Use  . Smoking status: Current Every Day Smoker  . Smokeless tobacco: Never Used  Substance Use Topics  . Alcohol use: Yes    Comment: social  . Drug use: No     Allergies   Codeine, Flagyl [metronidazole], Percocet [oxycodone-acetaminophen], and Latex   Review of Systems Review of Systems  Constitutional: Positive for fatigue. Negative for fever.  HENT: Positive for rhinorrhea. Negative for congestion, dental problem, ear pain, facial swelling, hearing loss, sinus pain, sore throat, trouble swallowing and voice change.   Eyes: Negative for photophobia, pain and visual disturbance.  Respiratory: Positive for cough. Negative for shortness of breath and wheezing.   Cardiovascular: Negative for chest pain and palpitations.  Gastrointestinal: Negative for diarrhea and vomiting.  Musculoskeletal: Positive for myalgias. Negative for arthralgias, back pain and neck pain.  Neurological: Positive for headaches. Negative for dizziness, facial asymmetry and light-headedness.     Physical Exam  Triage Vital Signs ED Triage Vitals  Enc Vitals Group     BP 02/16/19 1023 114/78     Pulse Rate 02/16/19 1023 85     Resp 02/16/19 1023 18     Temp 02/16/19 1023 98.5 F (36.9 C)     Temp src --      SpO2 02/16/19 1023 95 %     Weight --      Height --      Head Circumference --      Peak Flow --      Pain Score 02/16/19 1019 1     Pain Loc --      Pain Edu? --      Excl. in [place]? --    No data found.  Updated Vital Signs BP 114/78   Pulse 85   Temp 98.5 F (36.9 C)   Resp 18   LMP 10/12/2013   SpO2 95%   Visual Acuity Right Eye Distance:   Left Eye Distance:   Bilateral Distance:    Right Eye Near:   Left Eye Near:    Bilateral Near:     Physical Exam Constitutional:      General:  She is not in acute distress.    Appearance: She is normal weight. She is not toxic-appearing.  HENT:     Head: Normocephalic and atraumatic.     Jaw: There is normal jaw occlusion. No tenderness or pain on movement.     Right Ear: Hearing, tympanic membrane, ear canal and external ear normal. No tenderness. No mastoid tenderness.     Left Ear: Hearing, tympanic membrane, ear canal and external ear normal. No tenderness. No mastoid tenderness.     Nose: No nasal deformity, septal deviation or nasal tenderness.     Right Turbinates: Not swollen or pale.     Left Turbinates: Not swollen or pale.     Right Sinus: No maxillary sinus tenderness or frontal sinus tenderness.     Left Sinus: No maxillary sinus tenderness or frontal sinus tenderness.     Comments: Negative sinus tenderness bilaterally    Mouth/Throat:     Lips: Pink. No lesions.     Mouth: Mucous membranes are moist. No injury.     Pharynx: Oropharynx is clear. Uvula midline. No posterior oropharyngeal erythema or uvula swelling.     Comments: no tonsillar exudate or hypertrophy Eyes:     General: No scleral icterus.    Pupils: Pupils are equal, round, and reactive to light.  Neck:     Musculoskeletal: Normal range of motion and neck supple. No muscular tenderness.  Cardiovascular:     Rate and Rhythm: Normal rate.  Pulmonary:     Effort: Pulmonary effort is normal. No respiratory distress.     Breath sounds: No wheezing.  Lymphadenopathy:     Cervical: No cervical adenopathy.  Skin:    Coloration: Skin is not jaundiced or pale.  Neurological:     Mental Status: She is alert and oriented to person, place, and time.      UC Treatments / Results  Labs (all labs ordered are listed, but only abnormal results are displayed) Labs Reviewed  NOVEL CORONAVIRUS, NAA  POC SARS CORONAVIRUS 2 AG -  ED    EKG   Radiology No results found.  Procedures Procedures (including critical care time)  Medications Ordered in UC  Medications - No data to display  Initial Impression / Assessment and Plan / UC Course  I  have reviewed the triage vital signs and the nursing notes.  Pertinent labs & imaging results that were available during my care of the patient were reviewed by me and considered in my medical decision making (see chart for details).     POC Covid done in office, reviewed by me: Negative-PCR pending: Patient to quarantine until results are back.  We will continue symptomatic management as this has been successful.  Return precautions discussed, patient verbalized understanding and is agreeable to plan. Final Clinical Impressions(s) / UC Diagnoses   Final diagnoses:  Exposure to COVID-19 virus     Discharge Instructions     Your COVID test is pending - it is important to quarantine / isolate at home until your results are back. If you test positive and would like further evaluation for persistent or worsening symptoms, you may schedule an E-visit or virtual (video) visit throughout the [facility] MyChart app or website.  PLEASE NOTE: If you develop severe chest pain or shortness of breath please go to the ER or call 9-1-1 for further evaluation --> DO NOT schedule electronic or virtual visits for this. Please call our office for further guidance / recommendations as needed.    ED Prescriptions    None     PDMP not reviewed this encounter.   Hall-Potvin, [place], [place] JerseyPA-C 02/16/19 1058

## 2019-02-16 NOTE — Discharge Instructions (Addendum)
Your COVID test is pending - it is important to quarantine / isolate at home until your results are back. °If you test positive and would like further evaluation for persistent or worsening symptoms, you may schedule an E-visit or virtual (video) visit throughout the [facility] MyChart app or website. ° °PLEASE NOTE: If you develop severe chest pain or shortness of breath please go to the ER or call 9-1-1 for further evaluation --> DO NOT schedule electronic or virtual visits for this. °Please call our office for further guidance / recommendations as needed. °

## 2019-02-18 LAB — NOVEL CORONAVIRUS, NAA: SARS-CoV-2, NAA: NOT DETECTED

## 2019-03-04 ENCOUNTER — Other Ambulatory Visit: Payer: Self-pay

## 2019-03-04 ENCOUNTER — Encounter: Payer: Self-pay | Admitting: Emergency Medicine

## 2019-03-04 ENCOUNTER — Ambulatory Visit
Admission: EM | Admit: 2019-03-04 | Discharge: 2019-03-04 | Disposition: A | Discharge status: Home / Self Care | Payer: 59 | Attending: Physician Assistant | Admitting: Physician Assistant

## 2019-03-04 DIAGNOSIS — F17200 Nicotine dependence, unspecified, uncomplicated: Secondary | ICD-10-CM

## 2019-03-04 DIAGNOSIS — R05 Cough: Secondary | ICD-10-CM

## 2019-03-04 DIAGNOSIS — R059 Cough, unspecified: Secondary | ICD-10-CM

## 2019-03-04 DIAGNOSIS — Z20828 Contact with and (suspected) exposure to other viral communicable diseases: Secondary | ICD-10-CM | POA: Diagnosis not present

## 2019-03-04 MED ORDER — PREDNISONE 50 MG PO TABS: 50.0000 mg | ORAL_TABLET | Freq: Every day | ORAL | 0 refills | Status: AC

## 2019-03-04 NOTE — Discharge Instructions (Signed)
COVID PCR testing ordered. I would like you to quarantine until testing results. Prednisone as directed. If experiencing shortness of breath, trouble breathing, go to the emergency department for further evaluation needed.

## 2019-03-04 NOTE — ED Triage Notes (Addendum)
Pt presents to [facility] for assessment after girlfriend was exposed in the last week.  Patient states cough x 1 week.  Pt states she took antibiotics for a sinus infection, missed a few doses, and just finished it a few days ago.  C/o scratchy throat and headaches.

## 2019-03-04 NOTE — ED Provider Notes (Signed)
[facility]    CSN: [id number] Arrival date & time: 03/04/19  1141      History   Chief Complaint Chief Complaint  Patient presents with  . COVID-Like Symptoms    HPI [name] is a 43 y.o. female.   43 year old female comes in for 2 week history of URI symptoms, 1 week history of worsening cough.  States was seen by PCP, and was given Septra for sinusitis.  She states had missed some doses, but just finished medications few days ago.  Has had mild improvement of symptoms.  Still having some throat irritation, headache, nasal congestion, postnasal drip.  Denies fever, chills, body aches.  Denies abdominal pain, nausea, vomiting, diarrhea.  Feels that she needs to breathe deeper, but denies shortness of breath.  Denies loss of taste or smell.  Girlfriends family member tested positive for Covid.     Past Medical History:  Diagnosis Date  . Anemia   . Anxiety   . Depression   . Heart burn    With pregnancy    Patient Active Problem List   Diagnosis Date Noted  . Status post repeat low transverse cesarean section 05/05/2011  . Smoking history 05/02/2011  . History of depression 05/02/2011  . Cluster headaches 05/02/2011  . History of spontaneous abortion 05/02/2011    Past Surgical History:  Procedure Laterality Date  . CESAREAN SECTION    . CESAREAN SECTION      OB History    Gravida  5   Para  3   Term  3   Preterm      AB  2   Living  2     SAB  2   TAB      Ectopic      Multiple      Live Births  2            Home Medications    Prior to Admission medications   Medication Sig Start Date End Date Taking? Authorizing Provider  ibuprofen (ADVIL,MOTRIN) 600 MG tablet Take 1 tablet (600 mg total) by mouth every 6 (six) hours as needed. 10/30/13   Mabe, Forbes Cellar, MD  loratadine (CLARITIN) 10 MG tablet Take 10 mg by mouth daily as needed. For allergies     [provider]  predniSONE (DELTASONE) 50 MG  tablet Take 1 tablet (50 mg total) by mouth daily with breakfast. 03/04/19   Tasia Catchings, Kimela Malstrom V, PA-C  ranitidine (ZANTAC) 75 MG tablet Take 75 mg by mouth 2 (two) times daily as needed. For indigestion  02/16/19  [provider]    Family History Family History  Problem Relation Age of Onset  . Healthy Mother   . Healthy Father     Social History Social History   Tobacco Use  . Smoking status: Current Every Day Smoker  . Smokeless tobacco: Never Used  Substance Use Topics  . Alcohol use: Yes    Comment: social  . Drug use: No     Allergies   Codeine, Flagyl [metronidazole], Percocet [oxycodone-acetaminophen], and Latex   Review of Systems Review of Systems  Reason unable to perform ROS: See HPI as above.     Physical Exam Triage Vital Signs ED Triage Vitals  Enc Vitals Group     BP 03/04/19 1159 106/73     Pulse Rate 03/04/19 1159 86     Resp 03/04/19 1159 16     Temp 03/04/19 1159 (!) 97.1 F (  36.2 C)     Temp Source 03/04/19 1159 Temporal     SpO2 03/04/19 1159 96 %     Weight --      Height --      Head Circumference --      Peak Flow --      Pain Score 03/04/19 1201 0     Pain Loc --      Pain Edu? --      Excl. in [place]? --    No data found.  Updated Vital Signs BP 106/73 (BP Location: Left Arm)   Pulse 86   Temp (!) 97.1 F (36.2 C) (Temporal)   Resp 16   LMP 10/12/2013   SpO2 96%   Physical Exam Constitutional:      General: She is not in acute distress.    Appearance: Normal appearance. She is not ill-appearing, toxic-appearing or diaphoretic.  HENT:     Head: Normocephalic and atraumatic.     Mouth/Throat:     Mouth: Mucous membranes are moist.     Pharynx: Oropharynx is clear. Uvula midline.  Cardiovascular:     Rate and Rhythm: Normal rate and regular rhythm.     Heart sounds: Normal heart sounds. No murmur. No friction rub. No gallop.   Pulmonary:     Effort: Pulmonary effort is normal. No accessory muscle usage, prolonged  expiration, respiratory distress or retractions.     Comments: Lungs clear to auscultation without adventitious lung sounds. Musculoskeletal:     Cervical back: Normal range of motion and neck supple.  Skin:    General: Skin is warm and dry.  Neurological:     General: No focal deficit present.     Mental Status: She is alert and oriented to person, place, and time.    UC Treatments / Results  Labs (all labs ordered are listed, but only abnormal results are displayed) Labs Reviewed  NOVEL CORONAVIRUS, NAA    EKG   Radiology No results found.  Procedures Procedures (including critical care time)  Medications Ordered in UC Medications - No data to display  Initial Impression / Assessment and Plan / UC Course  I have reviewed the triage vital signs and the nursing notes.  Pertinent labs & imaging results that were available during my care of the patient were reviewed by me and considered in my medical decision making (see chart for details).    Will send for Covid testing due to positive Covid exposure, 1 week worsening of cough.  However, discussed cough could be due to ongoing URI symptoms for the past 2 weeks as well.  No signs of pneumonia, bacterial sinusitis at this time.  Will have patient start prednisone for symptomatic management.  Return precautions given.  Patient expresses understanding and agrees to plan.  Final Clinical Impressions(s) / UC Diagnoses   Final diagnoses:  Cough   ED Prescriptions    Medication Sig Dispense Auth. Provider   predniSONE (DELTASONE) 50 MG tablet Take 1 tablet (50 mg total) by mouth daily with breakfast. 5 tablet Belinda Fisher, PA-C     PDMP not reviewed this encounter.   Belinda Fisher, PA-C 03/04/19 1559

## 2019-03-05 LAB — NOVEL CORONAVIRUS, NAA: SARS-CoV-2, NAA: NOT DETECTED

## 2019-10-15 ENCOUNTER — Ambulatory Visit
Admission: RE | Admit: 2019-10-15 | Discharge: 2019-10-15 | Disposition: A | Discharge status: Home / Self Care | Payer: 59 | Source: Ambulatory Visit | Attending: Physician Assistant | Admitting: Physician Assistant

## 2019-10-15 ENCOUNTER — Other Ambulatory Visit: Payer: Self-pay | Admitting: Physician Assistant

## 2019-10-15 DIAGNOSIS — T148XXA Other injury of unspecified body region, initial encounter: Secondary | ICD-10-CM

## 2021-04-02 IMAGING — DX DG WRIST COMPLETE 3+V*L*
4 series · 4 of 4 positions shown · non-contrast
Comparison: None.

CLINICAL DATA: Cat bite

EXAM:
LEFT WRIST - COMPLETE 3+ VIEW

[dg wrist complete left (1 of 4)]
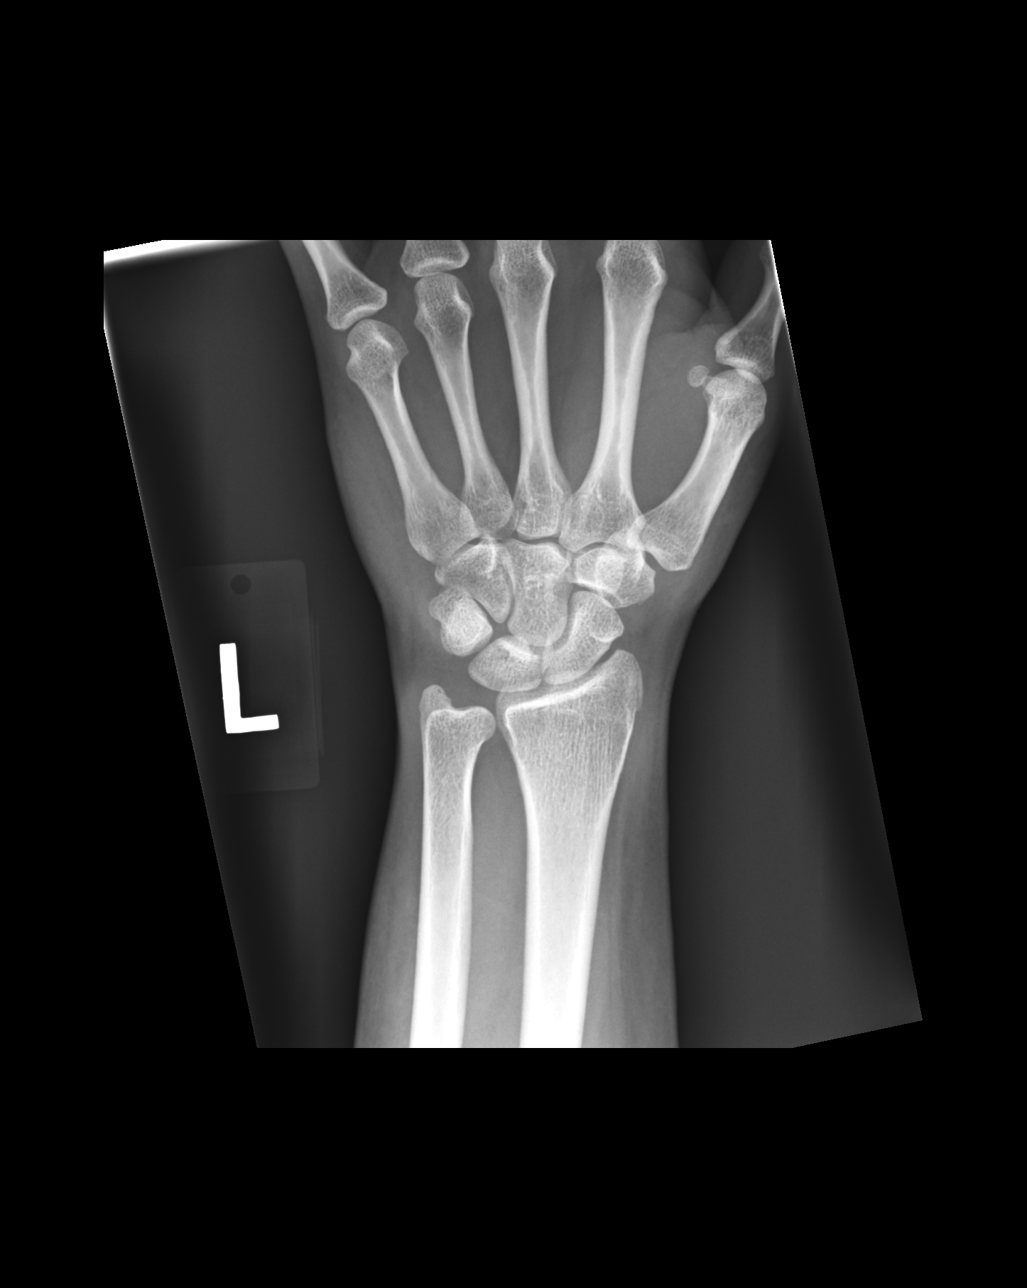

[dg wrist complete left (2 of 4)]
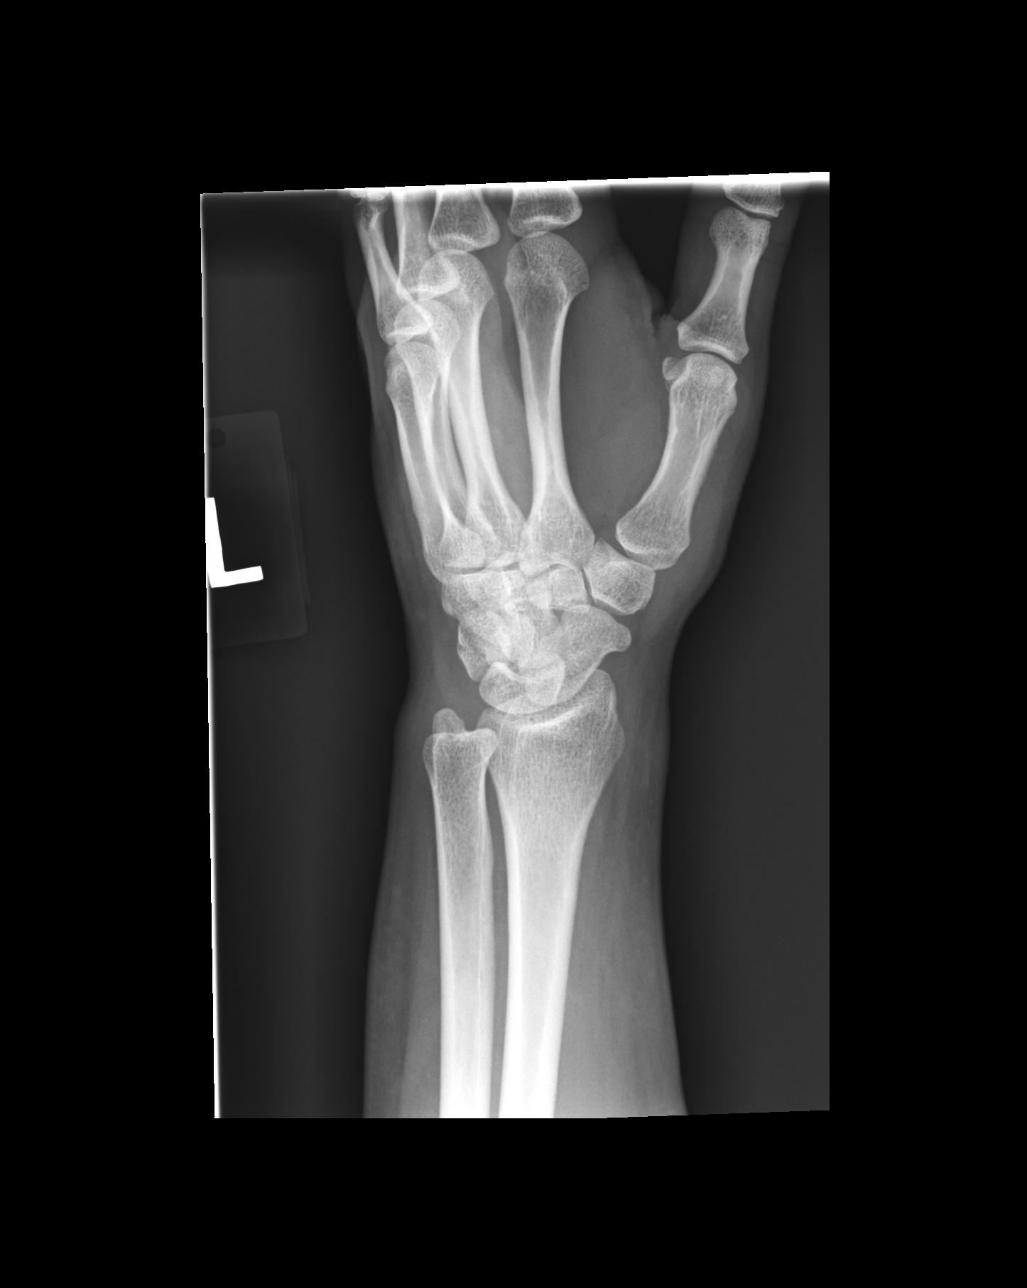

[dg wrist complete left (3 of 4)]
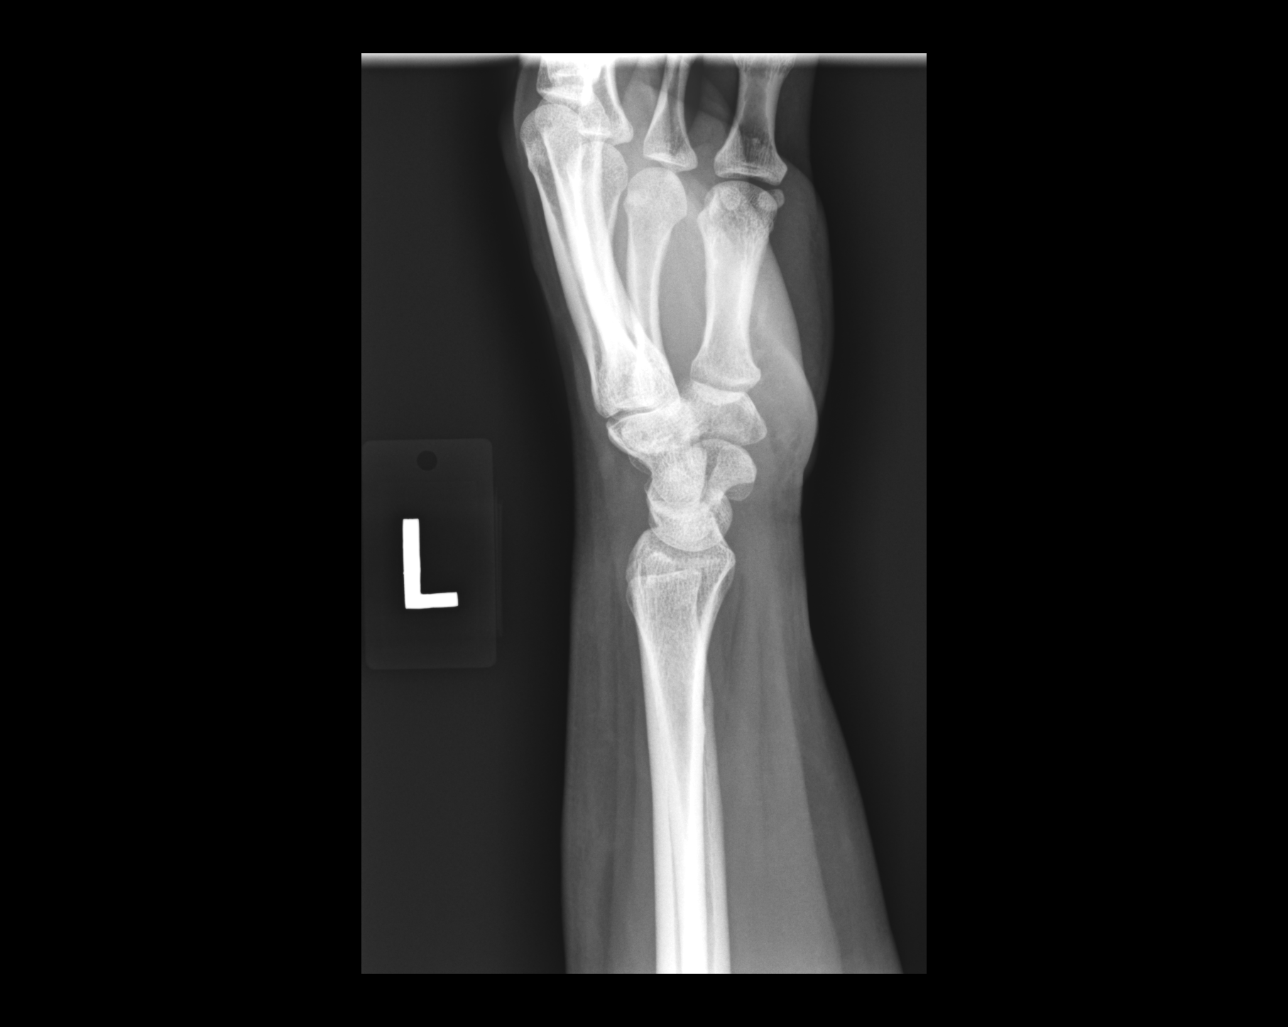

[dg wrist complete left (4 of 4)]
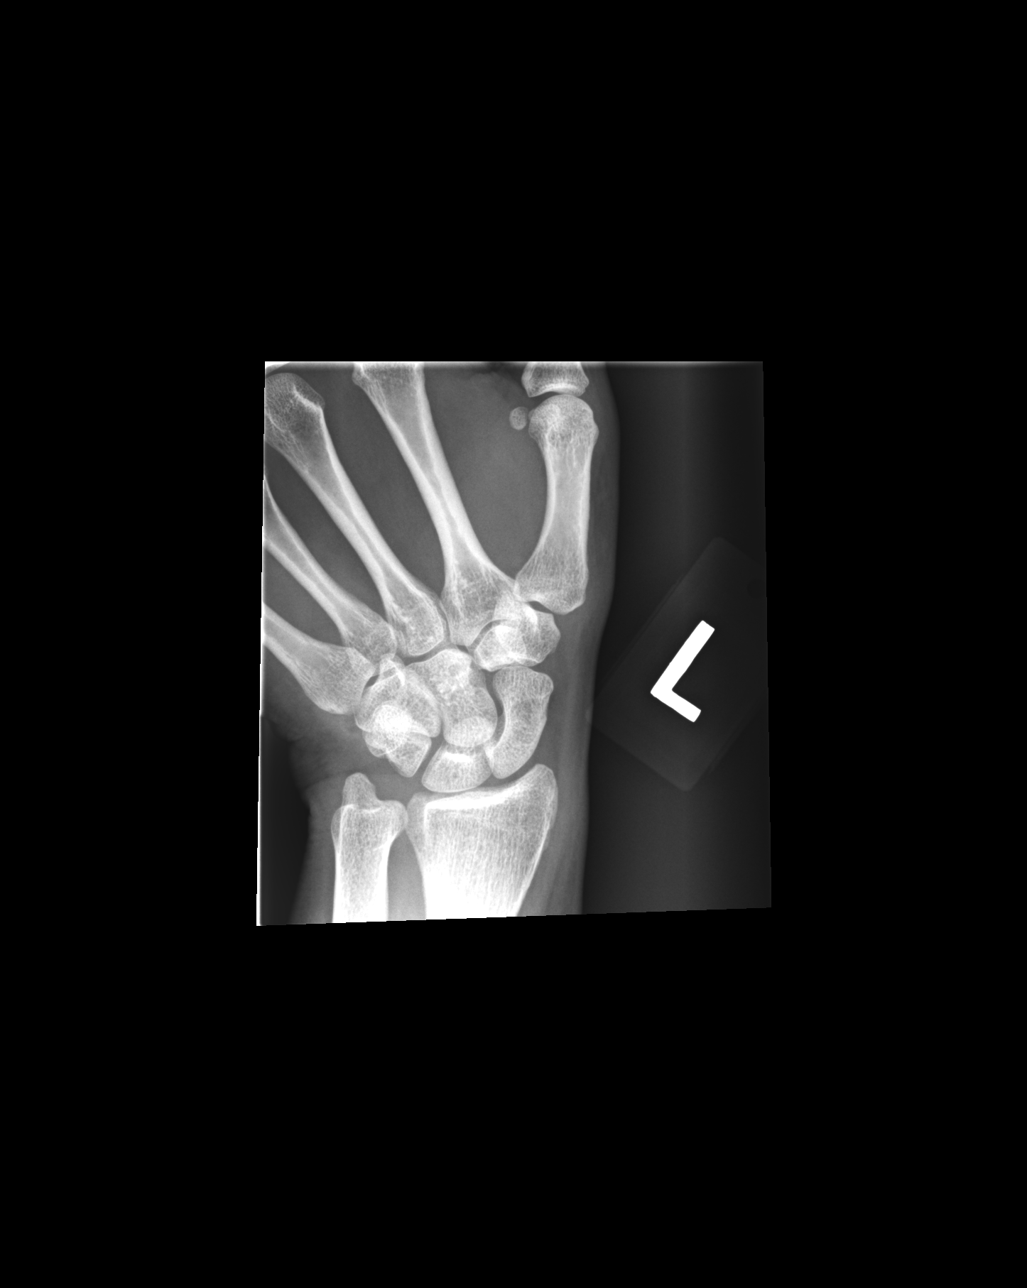

[4 of 4 positions shown; findings below may reference images not displayed]

FINDINGS: There is no evidence of fracture or dislocation. There is no
evidence of arthropathy or other focal bone abnormality. Soft
tissues are unremarkable.
IMPRESSION: Negative.

## 2021-04-05 ENCOUNTER — Other Ambulatory Visit: Payer: Self-pay | Admitting: Physician Assistant

## 2021-04-05 ENCOUNTER — Ambulatory Visit
Admission: RE | Admit: 2021-04-05 | Discharge: 2021-04-05 | Disposition: A | Discharge status: Home / Self Care | Payer: No Typology Code available for payment source | Source: Ambulatory Visit | Attending: Physician Assistant | Admitting: Physician Assistant

## 2021-04-05 DIAGNOSIS — R059 Cough, unspecified: Secondary | ICD-10-CM

## 2022-01-23 ENCOUNTER — Other Ambulatory Visit: Payer: Self-pay | Admitting: Physician Assistant

## 2022-01-23 DIAGNOSIS — R109 Unspecified abdominal pain: Secondary | ICD-10-CM

## 2022-01-31 ENCOUNTER — Ambulatory Visit
Admission: RE | Admit: 2022-01-31 | Discharge: 2022-01-31 | Disposition: A | Discharge status: Home / Self Care | Payer: No Typology Code available for payment source | Source: Ambulatory Visit | Attending: Physician Assistant | Admitting: Physician Assistant

## 2022-01-31 DIAGNOSIS — R109 Unspecified abdominal pain: Secondary | ICD-10-CM

## 2022-04-03 DIAGNOSIS — K589 Irritable bowel syndrome without diarrhea: Secondary | ICD-10-CM | POA: Diagnosis not present

## 2022-04-03 DIAGNOSIS — K8020 Calculus of gallbladder without cholecystitis without obstruction: Secondary | ICD-10-CM | POA: Diagnosis not present

## 2022-04-03 DIAGNOSIS — K219 Gastro-esophageal reflux disease without esophagitis: Secondary | ICD-10-CM | POA: Diagnosis not present

## 2022-04-03 DIAGNOSIS — K2960 Other gastritis without bleeding: Secondary | ICD-10-CM | POA: Diagnosis not present

## 2022-08-15 DIAGNOSIS — R21 Rash and other nonspecific skin eruption: Secondary | ICD-10-CM | POA: Diagnosis not present

## 2022-08-15 DIAGNOSIS — J029 Acute pharyngitis, unspecified: Secondary | ICD-10-CM | POA: Diagnosis not present

## 2022-08-15 DIAGNOSIS — Z03818 Encounter for observation for suspected exposure to other biological agents ruled out: Secondary | ICD-10-CM | POA: Diagnosis not present

## 2022-08-15 DIAGNOSIS — H109 Unspecified conjunctivitis: Secondary | ICD-10-CM | POA: Diagnosis not present

## 2022-08-15 DIAGNOSIS — R059 Cough, unspecified: Secondary | ICD-10-CM | POA: Diagnosis not present

## 2022-09-02 DIAGNOSIS — J029 Acute pharyngitis, unspecified: Secondary | ICD-10-CM | POA: Diagnosis not present

## 2022-09-02 DIAGNOSIS — J329 Chronic sinusitis, unspecified: Secondary | ICD-10-CM | POA: Diagnosis not present

## 2022-09-22 IMAGING — CR DG CHEST 2V
2 series · 2 of 2 positions shown · non-contrast
Comparison: Prior chest radiographs performed 11/22/2014.

CLINICAL DATA: Provided history: Cough, unspecified type.
Additional history provided by technologist: Patient reports
right-sided chest pain and congestion for 1.5 months. Current
smoker.

EXAM:
CHEST - 2 VIEW

[w chest pa]
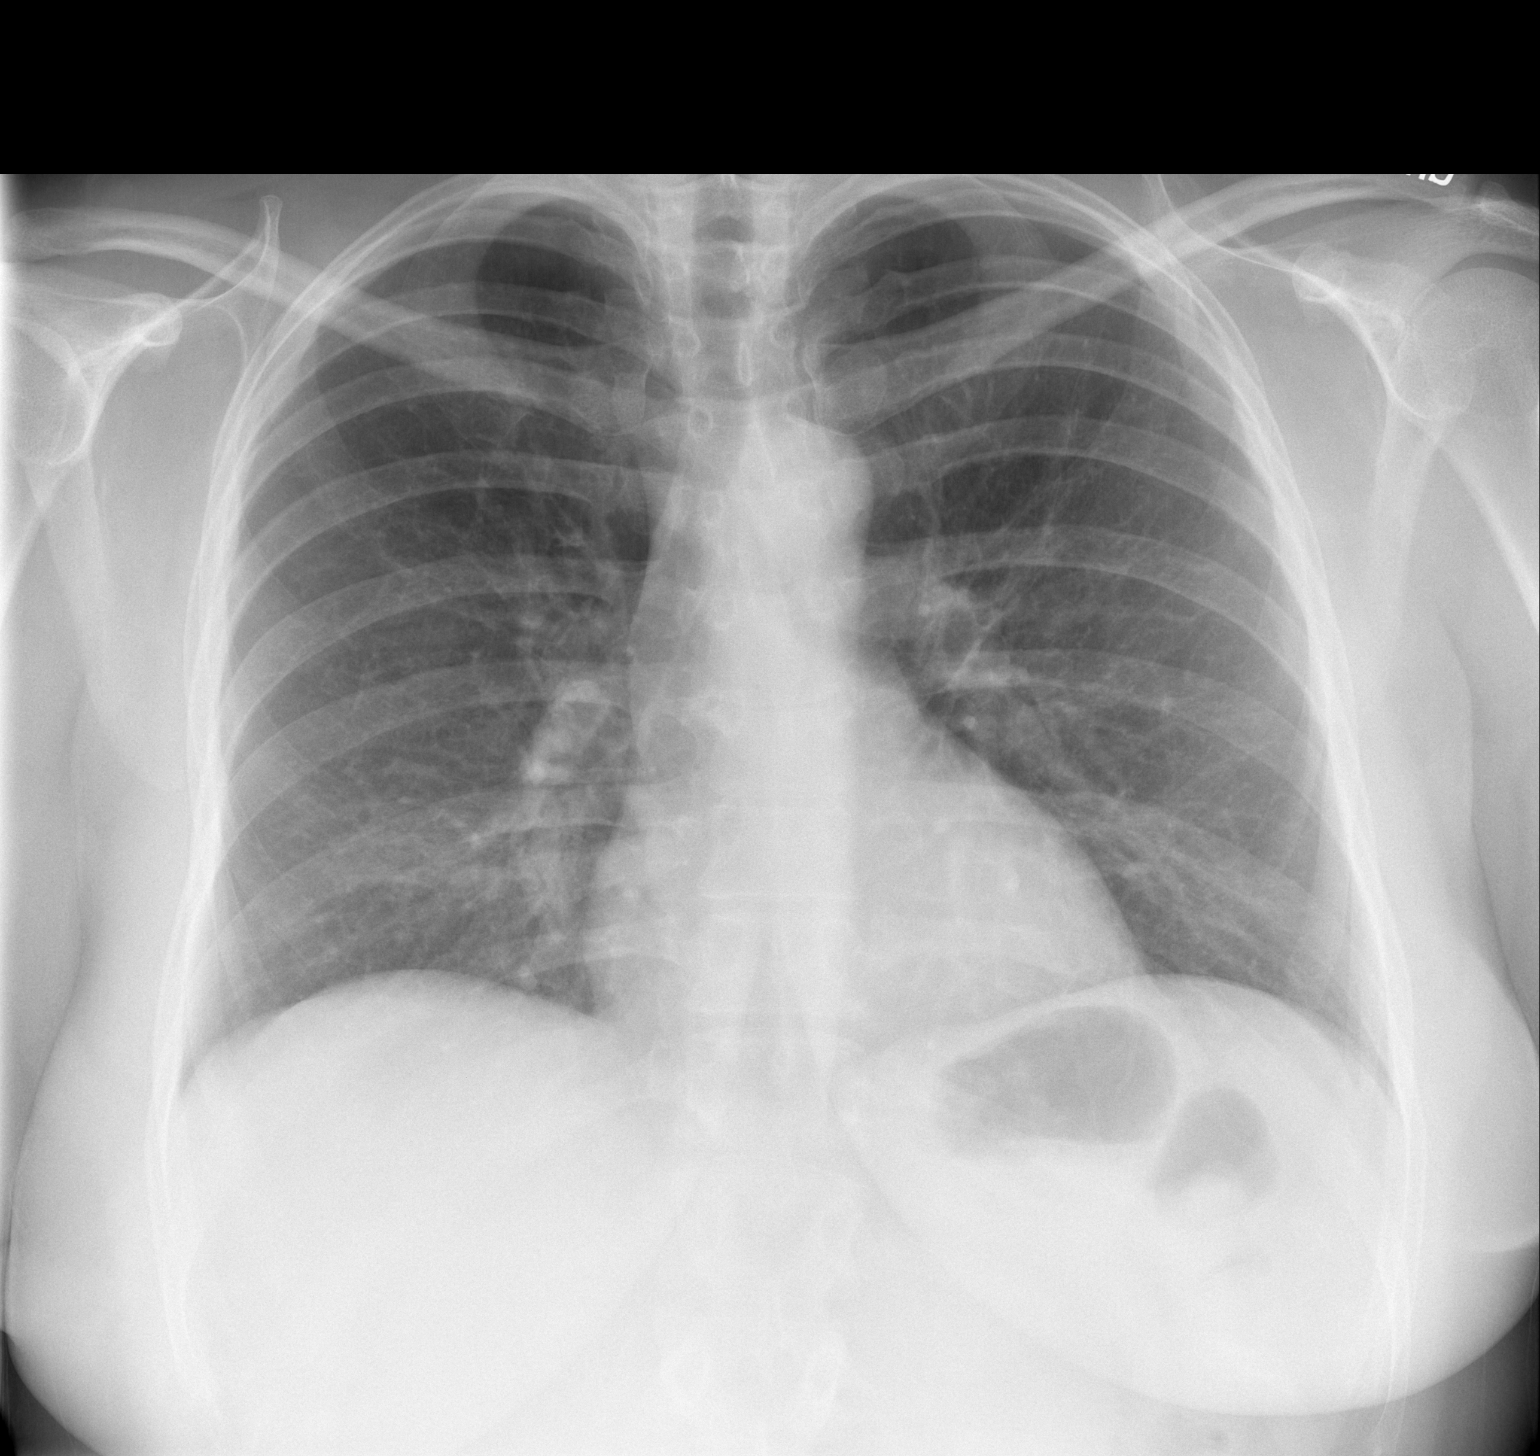

[w chest lat]
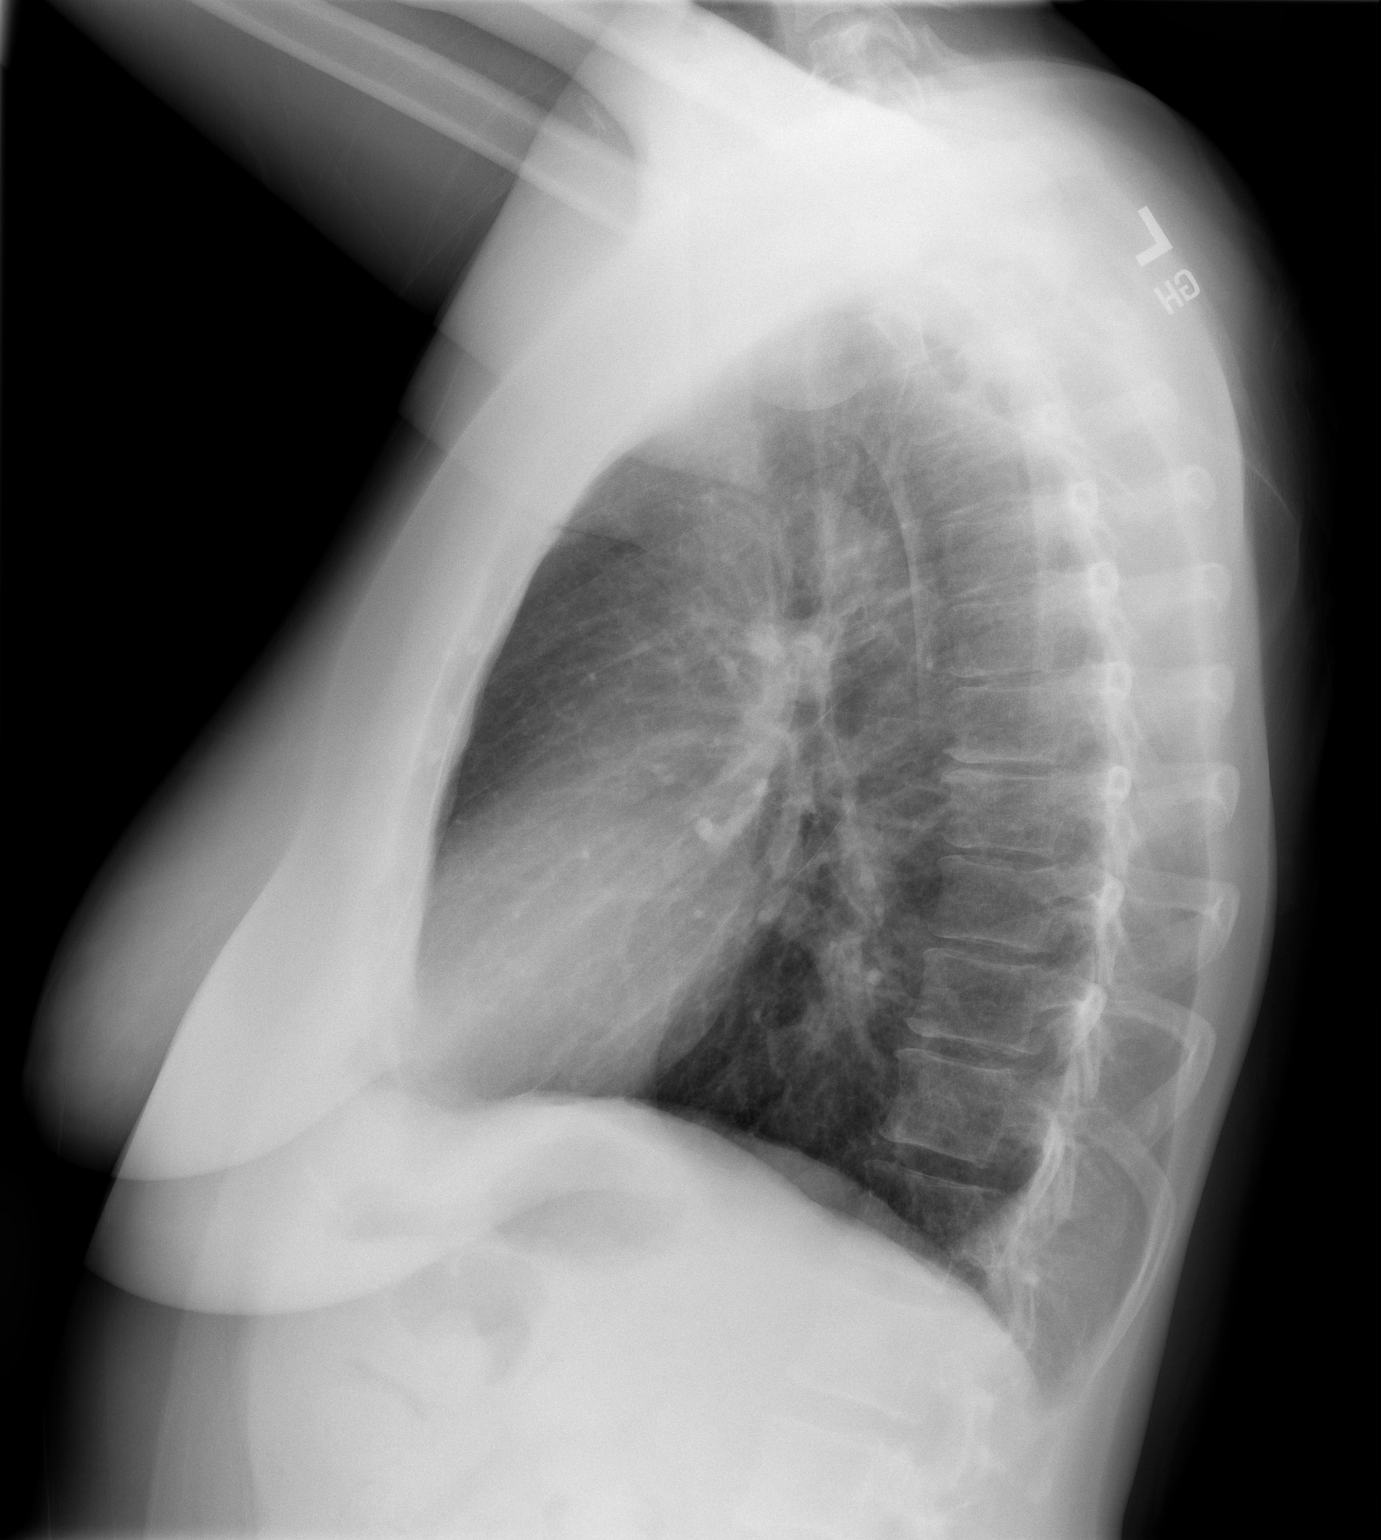

[2 of 2 positions shown; findings below may reference images not displayed]

FINDINGS: Heart size within normal limits. No appreciable airspace
consolidation. No evidence of pleural effusion or pneumothorax. No
acute bony abnormality identified. Mild thoracic levocurvature.
IMPRESSION: No evidence of active cardiopulmonary disease.

## 2022-10-25 DIAGNOSIS — J0140 Acute pansinusitis, unspecified: Secondary | ICD-10-CM | POA: Diagnosis not present

## 2022-11-28 DIAGNOSIS — J329 Chronic sinusitis, unspecified: Secondary | ICD-10-CM | POA: Diagnosis not present

## 2022-11-28 DIAGNOSIS — Z03818 Encounter for observation for suspected exposure to other biological agents ruled out: Secondary | ICD-10-CM | POA: Diagnosis not present

## 2022-11-28 DIAGNOSIS — K219 Gastro-esophageal reflux disease without esophagitis: Secondary | ICD-10-CM | POA: Diagnosis not present

## 2022-11-28 DIAGNOSIS — R051 Acute cough: Secondary | ICD-10-CM | POA: Diagnosis not present

## 2023-02-14 DIAGNOSIS — J0101 Acute recurrent maxillary sinusitis: Secondary | ICD-10-CM | POA: Diagnosis not present

## 2023-02-14 DIAGNOSIS — R051 Acute cough: Secondary | ICD-10-CM | POA: Diagnosis not present

## 2023-02-14 DIAGNOSIS — B370 Candidal stomatitis: Secondary | ICD-10-CM | POA: Diagnosis not present

## 2023-02-14 DIAGNOSIS — M7918 Myalgia, other site: Secondary | ICD-10-CM | POA: Diagnosis not present

## 2023-03-17 DIAGNOSIS — S060X0A Concussion without loss of consciousness, initial encounter: Secondary | ICD-10-CM | POA: Diagnosis not present

## 2023-03-17 DIAGNOSIS — S0990XA Unspecified injury of head, initial encounter: Secondary | ICD-10-CM | POA: Diagnosis not present

## 2023-03-20 DIAGNOSIS — S060XAA Concussion with loss of consciousness status unknown, initial encounter: Secondary | ICD-10-CM | POA: Diagnosis not present

## 2023-12-01 ENCOUNTER — Other Ambulatory Visit: Payer: Self-pay

## 2023-12-01 ENCOUNTER — Emergency Department (HOSPITAL_BASED_OUTPATIENT_CLINIC_OR_DEPARTMENT_OTHER)
Admission: EM | Admit: 2023-12-01 | Discharge: 2023-12-01 | Disposition: A | Discharge status: Home / Self Care | Payer: Self-pay | Attending: Emergency Medicine | Admitting: Emergency Medicine

## 2023-12-01 ENCOUNTER — Encounter (HOSPITAL_BASED_OUTPATIENT_CLINIC_OR_DEPARTMENT_OTHER): Payer: Self-pay | Admitting: *Deleted

## 2023-12-01 ENCOUNTER — Emergency Department (HOSPITAL_BASED_OUTPATIENT_CLINIC_OR_DEPARTMENT_OTHER): Payer: Self-pay

## 2023-12-01 DIAGNOSIS — Z91040 Latex allergy status: Secondary | ICD-10-CM | POA: Insufficient documentation

## 2023-12-01 DIAGNOSIS — R1013 Epigastric pain: Secondary | ICD-10-CM | POA: Diagnosis not present

## 2023-12-01 DIAGNOSIS — R0789 Other chest pain: Secondary | ICD-10-CM | POA: Insufficient documentation

## 2023-12-01 DIAGNOSIS — R079 Chest pain, unspecified: Secondary | ICD-10-CM | POA: Diagnosis not present

## 2023-12-01 DIAGNOSIS — R109 Unspecified abdominal pain: Secondary | ICD-10-CM | POA: Diagnosis not present

## 2023-12-01 DIAGNOSIS — R519 Headache, unspecified: Secondary | ICD-10-CM | POA: Diagnosis not present

## 2023-12-01 DIAGNOSIS — R42 Dizziness and giddiness: Secondary | ICD-10-CM | POA: Diagnosis not present

## 2023-12-01 DIAGNOSIS — R1012 Left upper quadrant pain: Secondary | ICD-10-CM | POA: Diagnosis not present

## 2023-12-01 DIAGNOSIS — R1011 Right upper quadrant pain: Secondary | ICD-10-CM | POA: Diagnosis not present

## 2023-12-01 LAB — COMPREHENSIVE METABOLIC PANEL WITH GFR
ALT: 16 U/L (ref 0–44)
AST: 21 U/L (ref 15–41)
Albumin: 4.7 g/dL (ref 3.5–5.0)
Alkaline Phosphatase: 64 U/L (ref 38–126)
Anion gap: 15 (ref 5–15)
BUN: 10 mg/dL (ref 6–20)
CO2: 22 mmol/L (ref 22–32)
Calcium: 9.5 mg/dL (ref 8.9–10.3)
Chloride: 102 mmol/L (ref 98–111)
Creatinine, Ser: 0.72 mg/dL (ref 0.44–1.00)
GFR, Estimated: >60 mL/min (ref >60)
Glucose, Bld: 137 mg/dL — ABNORMAL HIGH (ref 70–99)
Potassium: 3.5 mmol/L (ref 3.5–5.1)
Sodium: 138 mmol/L (ref 135–145)
Total Bilirubin: <0.2 mg/dL (ref 0.0–1.2)
Total Protein: 7.5 g/dL (ref 6.5–8.1)

## 2023-12-01 LAB — RESP PANEL BY RT-PCR (RSV, FLU A&B, COVID)  RVPGX2
Influenza A by PCR: NEGATIVE
Influenza B by PCR: NEGATIVE
Resp Syncytial Virus by PCR: NEGATIVE
SARS Coronavirus 2 by RT PCR: NEGATIVE

## 2023-12-01 LAB — HCG, SERUM, QUALITATIVE: Preg, Serum: NEGATIVE

## 2023-12-01 LAB — CBC
HCT: 41.4 % (ref 36.0–46.0)
Hemoglobin: 13.8 g/dL (ref 12.0–15.0)
MCH: 27.8 pg (ref 26.0–34.0)
MCHC: 33.3 g/dL (ref 30.0–36.0)
MCV: 83.5 fL (ref 80.0–100.0)
Platelets: 352 K/uL (ref 150–400)
RBC: 4.96 MIL/uL (ref 3.87–5.11)
RDW: 13.3 % (ref 11.5–15.5)
WBC: 7.9 K/uL (ref 4.0–10.5)
nRBC: 0 % (ref 0.0–0.2)

## 2023-12-01 LAB — TROPONIN T, HIGH SENSITIVITY
Troponin T High Sensitivity: <15 ng/L (ref 0–19)
Troponin T High Sensitivity: <15 ng/L (ref 0–19)

## 2023-12-01 LAB — LIPASE, BLOOD: Lipase: 25 U/L (ref 11–51)

## 2023-12-01 NOTE — ED Triage Notes (Signed)
 Pt is here for evaluation of CP and abdominal pain and headache and feeling lightheaded.  This began Saturday and she was seen at [facility] today where she had an EKG but was sent here for further workup.

## 2023-12-01 NOTE — ED Provider Notes (Signed)
 [facility] Provider Note   CSN: [id number] Arrival date & time: 12/01/23  1428     Patient presents with: Chest Pain   [name] is a 48 y.o. female with history of GERD, smoking history, presents with concern for intermittent chest pain that started yesterday.  Reports she had episodes of pain in the left side of her chest that lasted a couple seconds to about 1 minute at a time.  This was sharp in nature.  Pain did not radiate to the back.  There is no associated shortness of breath or any pleuritic pain.  No pain with exertion.  She reports that today the frequency of these episodes have lessened, but she has still had a couple today.  She reports that before this pain started, she ate a large hamburger and fries at [employer].  She did take her home omeprazole without improvement in pain.  She also reports some epigastric pain that started this morning.  She denies any nausea, vomiting, diarrhea, fever, chills.  No cough, sore throat, or recent illnesses.  She denies any recent long plane or car rides, recent hospitalizations or surgeries, she is not on any hormonal medication such as OCPs.  She does not have any history of blood clots.  Denies any leg swelling or pain.    Chest Pain      Prior to Admission medications   Medication Sig Start Date End Date Taking? Authorizing Provider  ibuprofen  (ADVIL ,MOTRIN ) 600 MG tablet Take 1 tablet (600 mg total) by mouth every 6 (six) hours as needed. 10/30/13   MabeGlendale CROME, MD  loratadine  (CLARITIN ) 10 MG tablet Take 10 mg by mouth daily as needed. For allergies     [provider]  predniSONE  (DELTASONE ) 50 MG tablet Take 1 tablet (50 mg total) by mouth daily with breakfast. 03/04/19   Babara, Amy V, PA-C  ranitidine (ZANTAC) 75 MG tablet Take 75 mg by mouth 2 (two) times daily as needed. For indigestion  02/16/19  [provider]    Allergies: Codeine , Flagyl   [metronidazole ], Percocet [oxycodone-acetaminophen ], and Latex    Review of Systems  Cardiovascular:  Positive for chest pain.    Updated Vital Signs BP 106/69 (BP Location: Right Arm)   Pulse 75   Temp 98.5 F (36.9 C) (Oral)   Resp 18   LMP 10/12/2013   SpO2 99%   Physical Exam Vitals and nursing note reviewed.  Constitutional:      General: She is not in acute distress.    Appearance: She is well-developed.  HENT:     Head: Normocephalic and atraumatic.  Eyes:     Conjunctiva/sclera: Conjunctivae normal.  Cardiovascular:     Rate and Rhythm: Normal rate and regular rhythm.     Heart sounds: No murmur heard. Pulmonary:     Effort: Pulmonary effort is normal. No respiratory distress.     Breath sounds: Normal breath sounds.  Abdominal:     Palpations: Abdomen is soft.     Tenderness: There is no abdominal tenderness.  Musculoskeletal:        General: No swelling.       Arms:     Cervical back: Neck supple.     Right lower leg: No edema.     Left lower leg: No edema.     Comments: Tender to the left anterior chest wall  No calf edema or tenderness to palpation  Skin:    General:  Skin is warm and dry.     Capillary Refill: Capillary refill takes less than 2 seconds.  Neurological:     Mental Status: She is alert.  Psychiatric:        Mood and Affect: Mood normal.     (all labs ordered are listed, but only abnormal results are displayed) Labs Reviewed  COMPREHENSIVE METABOLIC PANEL WITH GFR - Abnormal; Notable for the following components:      Result Value   Glucose, Bld 137 (*)    All other components within normal limits  RESP PANEL BY RT-PCR (RSV, FLU A&B, COVID)  RVPGX2  CBC  LIPASE, BLOOD  HCG, SERUM, QUALITATIVE  TROPONIN T, HIGH SENSITIVITY  TROPONIN T, HIGH SENSITIVITY    EKG: EKG Interpretation Date/Time:  Monday December 01 2023 14:41:31 EDT Ventricular Rate:  84 PR Interval:  145 QRS Duration:  92 QT Interval:  377 QTC  Calculation: 446 R Axis:   64  Text Interpretation: Sinus rhythm Borderline T wave abnormalities no prior ECG for comparison No STEMI Confirmed by Ginger Barefoot ([id number]) on 12/01/2023 3:06:25 PM  Radiology: DG Chest 2 View Result Date: 12/01/2023 CLINICAL DATA:  Chest pain and abdominal pain with headache and lightheadedness. EXAM: CHEST - 2 VIEW COMPARISON:  February 14, 2023 FINDINGS: The heart size and mediastinal contours are within normal limits. Both lungs are clear. The visualized skeletal structures are unremarkable. IMPRESSION: No active cardiopulmonary disease. Electronically Signed   By: Suzen Dials M.D.   On: 12/01/2023 15:32     Procedures   Medications Ordered in the ED - No data to display              HEART Score: 1                    Medical Decision Making Amount and/or Complexity of Data Reviewed Labs: ordered. Radiology: ordered.     Differential diagnosis includes but is not limited to ACS, arrhythmia, aortic aneurysm, pericarditis, myocarditis, pericardial effusion, cardiac tamponade, musculoskeletal pain, GERD, Boerhaave's syndrome, DVT/PE, pneumonia, pleural effusion   ED Course:  Upon initial evaluation, patient is well-appearing, no acute distress.  Stable vitals.  She does have reproducible chest pain with palpation of the left anterior chest wall.  She does not have any abdominal tenderness palpation on my exam.  Legs without any edema, no calf tenderness to palpation.   Labs Ordered: I Ordered, and personally interpreted labs.  The pertinent results include:   CBC within normal limits CMP with mildly elevated glucose at 137, otherwise within normal limits Lipase within normal limits Pregnancy test negative Initial and repeat troponin under 15 COVID, flu, RSV negative  Imaging Studies ordered: I ordered imaging studies including chest x-ray I independently visualized the imaging with scope of interpretation limited to determining acute  life threatening conditions related to emergency care. Imaging showed no acute abnormalities I agree with the radiologist interpretation   Cardiac Monitoring: / EKG: The patient was maintained on a cardiac monitor.  I personally viewed and interpreted the cardiac monitored which showed an underlying rhythm of: Normal sinus rhythm, no ST elevations   Medications Given: None  Upon re-evaluation, patient remains well appearing with stable vitals.   Low concern for ACS at this time given troponin remains stable with initial troponin of less than 15 and repeat of less than 15, pain non-exertional, and EKG with normal sinus rhythm and no ST changes. HEART score of 1 due to age. Chest x-ray without  any acute abnormality. Low concern for DVT or PE at this time given PERC negative.  Low concern for acute intra-abdominal pathology given abdomen is soft and nontender, normal LFTs, creatinine, lipase, no leukocytosis. Patient did have pain to palpation of the anterior chest wall, I question if the pain could be more musculoskeletal in nature. Patient stable and appropriate for discharge home.   Impression: Atypical chest pain  Disposition:  The patient was discharged home with instructions to take Tylenol  and ibuprofen  as needed for pain.  I did send a referral to cardiology.  She understands if she has not heard from their office within the next 72 hours, she needs to call to schedule an appointment.  Their contact information was provided. Return precautions given.     This chart was dictated using voice recognition software, Dragon. Despite the best efforts of this provider to proofread and correct errors, errors may still occur which can change documentation meaning.       Final diagnoses:  Atypical chest pain    ED Discharge Orders          Ordered    Ambulatory referral to Cardiology       Comments: If you have not heard from the Cardiology office within the next 72 hours please call  [phone].   12/01/23 1724               Veta Palma, PA-C 12/01/23 1748    Tegeler, Lonni PARAS, MD 12/01/23 1818

## 2023-12-01 NOTE — Discharge Instructions (Addendum)
 Your workup today is reassuring. It is very unlikely that your pain is due to an issue in your heart.  Your cardiac enzyme (troponin) was normal today. Your EKG which is a measure of the heart's electrical activity and rhythm is normal today. These would both show abnormalities if you were having a heart attack.  Your chest x-ray is normal today.  Your blood counts, electrolytes, kidney, liver, and pancreas labs are normal today.  Your pregnancy test was negative.  You may take up to 1000mg  of tylenol  every 6 hours as needed for pain. Do not take more then 4g per day.   You may use up to 600mg  ibuprofen  every 6 hours as needed for pain.  Do not exceed 2.4g of ibuprofen  per day.  I have sent a referral to cardiology.  They should be contacting you within the next 72 hours to schedule an appointment.  If you have not heard from the Cardiology office within the next 72 hours please call [phone].  Return to the ER if you have any shortness of breath, difficulty breathing, worsening chest pain, dizziness, jaw pain, left arm or shoulder pain, abdominal pain, unexplained fever, any other new or concerning symptoms.

## 2023-12-26 DIAGNOSIS — M545 Low back pain, unspecified: Secondary | ICD-10-CM | POA: Diagnosis not present

## 2024-01-19 DIAGNOSIS — D259 Leiomyoma of uterus, unspecified: Secondary | ICD-10-CM | POA: Insufficient documentation

## 2024-01-19 DIAGNOSIS — R102 Pelvic and perineal pain unspecified side: Secondary | ICD-10-CM | POA: Insufficient documentation

## 2024-01-19 DIAGNOSIS — N946 Dysmenorrhea, unspecified: Secondary | ICD-10-CM | POA: Insufficient documentation

## 2024-01-19 DIAGNOSIS — G43909 Migraine, unspecified, not intractable, without status migrainosus: Secondary | ICD-10-CM | POA: Insufficient documentation

## 2024-01-19 DIAGNOSIS — F419 Anxiety disorder, unspecified: Secondary | ICD-10-CM | POA: Insufficient documentation

## 2024-01-19 DIAGNOSIS — N3010 Interstitial cystitis (chronic) without hematuria: Secondary | ICD-10-CM | POA: Insufficient documentation

## 2024-01-19 DIAGNOSIS — R12 Heartburn: Secondary | ICD-10-CM | POA: Insufficient documentation

## 2024-01-19 DIAGNOSIS — N926 Irregular menstruation, unspecified: Secondary | ICD-10-CM | POA: Insufficient documentation

## 2024-01-19 DIAGNOSIS — F32A Depression, unspecified: Secondary | ICD-10-CM | POA: Insufficient documentation

## 2024-01-19 DIAGNOSIS — D649 Anemia, unspecified: Secondary | ICD-10-CM | POA: Insufficient documentation

## 2024-01-21 ENCOUNTER — Ambulatory Visit
